# Patient Record
Sex: Male | Born: 1975 | Hispanic: Yes | Marital: Single | Smoking: Former smoker
Health system: Southern US, Community
[De-identification: ages and names within clinical notes are randomized; demographics above are authoritative.]

## PROBLEM LIST (undated history)

## (undated) DIAGNOSIS — S62509A Fracture of unspecified phalanx of unspecified thumb, initial encounter for closed fracture: Secondary | ICD-10-CM

## (undated) DIAGNOSIS — S52502A Unspecified fracture of the lower end of left radius, initial encounter for closed fracture: Secondary | ICD-10-CM

## (undated) HISTORY — PX: NO PAST SURGERIES: SHX2092

---

## 2014-12-18 ENCOUNTER — Emergency Department (HOSPITAL_COMMUNITY)
Admission: EM | Admit: 2014-12-18 | Discharge: 2014-12-18 | Discharge status: Against Medical Advice | Payer: Self-pay | Attending: Emergency Medicine | Admitting: Emergency Medicine

## 2014-12-18 ENCOUNTER — Encounter (HOSPITAL_COMMUNITY): Payer: Self-pay | Admitting: Emergency Medicine

## 2014-12-18 DIAGNOSIS — Z720 Tobacco use: Secondary | ICD-10-CM | POA: Insufficient documentation

## 2014-12-18 DIAGNOSIS — F10129 Alcohol abuse with intoxication, unspecified: Secondary | ICD-10-CM | POA: Insufficient documentation
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
## 2015-05-24 ENCOUNTER — Emergency Department (HOSPITAL_COMMUNITY)
Admission: EM | Admit: 2015-05-24 | Discharge: 2015-05-25 | Disposition: A | Discharge status: Home / Self Care | Payer: Self-pay | Attending: Emergency Medicine | Admitting: Emergency Medicine

## 2015-05-24 ENCOUNTER — Emergency Department (HOSPITAL_COMMUNITY): Payer: Self-pay

## 2015-05-24 ENCOUNTER — Encounter (HOSPITAL_COMMUNITY): Payer: Self-pay | Admitting: Emergency Medicine

## 2015-05-24 DIAGNOSIS — Y9389 Activity, other specified: Secondary | ICD-10-CM | POA: Insufficient documentation

## 2015-05-24 DIAGNOSIS — F10120 Alcohol abuse with intoxication, uncomplicated: Secondary | ICD-10-CM | POA: Insufficient documentation

## 2015-05-24 DIAGNOSIS — S0191XA Laceration without foreign body of unspecified part of head, initial encounter: Secondary | ICD-10-CM

## 2015-05-24 DIAGNOSIS — Z720 Tobacco use: Secondary | ICD-10-CM | POA: Insufficient documentation

## 2015-05-24 DIAGNOSIS — Y9289 Other specified places as the place of occurrence of the external cause: Secondary | ICD-10-CM | POA: Insufficient documentation

## 2015-05-24 DIAGNOSIS — S0101XA Laceration without foreign body of scalp, initial encounter: Secondary | ICD-10-CM | POA: Insufficient documentation

## 2015-05-24 DIAGNOSIS — Z23 Encounter for immunization: Secondary | ICD-10-CM | POA: Insufficient documentation

## 2015-05-24 DIAGNOSIS — Y998 Other external cause status: Secondary | ICD-10-CM | POA: Insufficient documentation

## 2015-05-24 MED ORDER — LIDOCAINE-EPINEPHRINE (PF) 2 %-1:200000 IJ SOLN
20.0000 mL | Freq: Once | INTRAMUSCULAR | Status: DC
  Filled 2015-05-24: qty 20

## 2015-05-24 MED ORDER — TETANUS-DIPHTH-ACELL PERTUSSIS 5-2.5-18.5 LF-MCG/0.5 IM SUSP
0.5000 mL | Freq: Once | INTRAMUSCULAR | Status: AC
  Administered 2015-05-24: 0.5 mL via INTRAMUSCULAR
  Filled 2015-05-24: qty 0.5
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
## 2015-05-25 MED ORDER — ONDANSETRON 8 MG PO TBDP
8.0000 mg | ORAL_TABLET | Freq: Once | ORAL | Status: AC
  Administered 2015-05-25: 8 mg via ORAL
  Filled 2015-05-25: qty 1
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
## 2015-05-25 NOTE — ED Notes (Signed)
Pt was able to ambulate without no assistance to the bathroom, pt has a steady gait

## 2015-05-25 NOTE — ED Provider Notes (Signed)
0200 - Patient ambulatory independently to the restroom without difficulty. Will discharge with return precautions.  Filed Vitals:   05/24/15 2002 05/24/15 2213 05/25/15 0016 05/25/15 0155  BP: 96/63 100/66 100/59 106/77  Pulse: 71 69 66 66  Temp: 97.4 F (36.3 C)   97.5 F (36.4 C)  TempSrc: Oral   Oral  Resp: SpO2: 94% 95% 95% 97%     Antony Madura, PA-C 05/25/15 1610  April Palumbo, MD 05/25/15 [phone]

## 2015-06-07 ENCOUNTER — Encounter (HOSPITAL_COMMUNITY): Payer: Self-pay | Admitting: Emergency Medicine

## 2015-06-07 ENCOUNTER — Emergency Department (HOSPITAL_COMMUNITY)
Admission: EM | Admit: 2015-06-07 | Discharge: 2015-06-07 | Disposition: A | Discharge status: Home / Self Care | Payer: Self-pay | Attending: Emergency Medicine | Admitting: Emergency Medicine

## 2015-06-07 DIAGNOSIS — Z720 Tobacco use: Secondary | ICD-10-CM | POA: Insufficient documentation

## 2015-06-07 DIAGNOSIS — Z4802 Encounter for removal of sutures: Secondary | ICD-10-CM | POA: Insufficient documentation

## 2015-06-07 NOTE — ED Notes (Signed)
Per pt, states suture removal-back of head

## 2015-06-07 NOTE — Discharge Instructions (Signed)
Remoción de la sutura, cuidados posteriores  (Suture Removal, Care After)  Siga estas instrucciones durante las próximas semanas. Estas indicaciones le proporcionan información general acerca de cómo deberá cuidarse después del procedimiento. El médico también podrá darle instrucciones más específicas. El tratamiento se ha planificado de acuerdo a las prácticas médicas actuales, pero a veces se producen problemas. Comuníquese con el médico si tiene algún problema o tiene dudas después del procedimiento.  QUÉ ESPERAR DESPUÉS DEL PROCEDIMIENTO  Después de que le retiren los puntos (suturas), es normal experimentar lo siguiente:  · Molestias e hinchazón en la zona de la herida.  · Leve enrojecimiento en la zona de la herida.  INSTRUCCIONES PARA EL CUIDADO EN EL HOGAR   · Si tiene bandas adhesivas en la piel sobre la zona de la herida, no las retire. Se caerán solas en unos pocos días. Si las bandas adhesivas siguen en su lugar después de 14 días, entonces puede retirarlas.  · Cambie los apósitos (vendajes), al menos, una vez al día o según las instrucciones del médico. Si el vendaje se adhiere, remójelo con agua jabonosa tibia.  · Solo aplique un ungüento o crema según las indicaciones del médico. Si usa crema o ungüento, lave la zona con agua y jabón dos veces por día para quitarlo todo. Enjuague el jabón y seque suavemente la zona con una toalla limpia.  · Mantenga la herida limpia y seca. Si el vendaje se moja, se ensucia o tiene mal olor, cámbielo tan pronto como pueda.  · Continúe protegiendo la herida de lesiones.  · Use protector solar cuando salga al sol. Las cicatrices nuevas se broncean fácilmente.  SOLICITE ATENCIÓN MÉDICA SI:  · Aumenta el enrojecimiento, la hinchazón o el dolor en la zona afectada.  · Observa que sale pus de la herida.  · Tiene fiebre.  · Advierte un olor fétido que proviene de la herida o del vendaje.  · La herida se abre (los bordes se separan).     Esta información no tiene como fin  reemplazar el consejo del médico. Asegúrese de hacerle al médico cualquier pregunta que tenga.     Document Released: 05/16/2005 Document Revised: 05/27/2013  Elsevier Interactive Patient Education ©2016 Elsevier Inc.
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
## 2016-01-25 ENCOUNTER — Encounter (HOSPITAL_COMMUNITY): Payer: Self-pay | Admitting: Emergency Medicine

## 2016-01-25 ENCOUNTER — Emergency Department (HOSPITAL_COMMUNITY)
Admission: EM | Admit: 2016-01-25 | Discharge: 2016-01-25 | Disposition: A | Discharge status: Home / Self Care | Payer: Self-pay | Attending: Emergency Medicine | Admitting: Emergency Medicine

## 2016-01-25 DIAGNOSIS — F17200 Nicotine dependence, unspecified, uncomplicated: Secondary | ICD-10-CM | POA: Insufficient documentation

## 2016-01-25 DIAGNOSIS — E876 Hypokalemia: Secondary | ICD-10-CM | POA: Insufficient documentation

## 2016-01-25 DIAGNOSIS — F10920 Alcohol use, unspecified with intoxication, uncomplicated: Secondary | ICD-10-CM

## 2016-01-25 DIAGNOSIS — F10129 Alcohol abuse with intoxication, unspecified: Secondary | ICD-10-CM | POA: Insufficient documentation

## 2016-01-25 DIAGNOSIS — Y908 Blood alcohol level of 240 mg/100 ml or more: Secondary | ICD-10-CM | POA: Insufficient documentation

## 2016-01-25 LAB — COMPREHENSIVE METABOLIC PANEL
ALBUMIN: 3.8 g/dL (ref 3.5–5.0)
ALT: 19 U/L (ref 17–63)
AST: 24 U/L (ref 15–41)
Alkaline Phosphatase: 48 U/L (ref 38–126)
Anion gap: 10 (ref 5–15)
BILIRUBIN TOTAL: 0.2 mg/dL — AB (ref 0.3–1.2)
CHLORIDE: 109 mmol/L (ref 101–111)
CO2: 22 mmol/L (ref 22–32)
CREATININE: 0.45 mg/dL — AB (ref 0.61–1.24)
Calcium: 9 mg/dL (ref 8.9–10.3)
Glucose, Bld: 104 mg/dL — ABNORMAL HIGH (ref 65–99)
Potassium: 3.2 mmol/L — ABNORMAL LOW (ref 3.5–5.1)
Sodium: 141 mmol/L (ref 135–145)
TOTAL PROTEIN: 6.7 g/dL (ref 6.5–8.1)

## 2016-01-25 LAB — ETHANOL: ALCOHOL ETHYL (B): 310 mg/dL — AB (ref <5)

## 2016-01-25 LAB — CBC WITH DIFFERENTIAL/PLATELET
BASOS ABS: 0 10*3/uL (ref 0.0–0.1)
BASOS PCT: 1 %
EOS PCT: 1 %
Eosinophils Absolute: 0 10*3/uL (ref 0.0–0.7)
HCT: 43.3 % (ref 39.0–52.0)
Hemoglobin: 14.7 g/dL (ref 13.0–17.0)
Lymphocytes Relative: 30 %
Lymphs Abs: 1.3 10*3/uL (ref 0.7–4.0)
MCH: 30.8 pg (ref 26.0–34.0)
MCHC: 33.9 g/dL (ref 30.0–36.0)
MCV: 90.8 fL (ref 78.0–100.0)
MONO ABS: 0.3 10*3/uL (ref 0.1–1.0)
Monocytes Relative: 6 %
Neutro Abs: 2.8 10*3/uL (ref 1.7–7.7)
Neutrophils Relative %: 62 %
PLATELETS: 303 10*3/uL (ref 150–400)
RBC: 4.77 MIL/uL (ref 4.22–5.81)
RDW: 12.7 % (ref 11.5–15.5)
WBC: 4.4 10*3/uL (ref 4.0–10.5)

## 2016-01-25 LAB — SALICYLATE LEVEL

## 2016-01-25 LAB — ACETAMINOPHEN LEVEL: Acetaminophen (Tylenol), Serum: <10 ug/mL — ABNORMAL LOW (ref 10–30)

## 2016-01-25 MED ORDER — SODIUM CHLORIDE 0.9 % IV BOLUS (SEPSIS)
1000.0000 mL | Freq: Once | INTRAVENOUS | Status: AC
Start: 2016-01-25 — End: 2016-01-25
  Administered 2016-01-25: 1000 mL via INTRAVENOUS

## 2016-01-25 MED ORDER — POTASSIUM CHLORIDE CRYS ER 20 MEQ PO TBCR
40.0000 meq | EXTENDED_RELEASE_TABLET | Freq: Once | ORAL | Status: AC
  Administered 2016-01-25: 40 meq via ORAL
  Filled 2016-01-25: qty 2

## 2016-01-25 NOTE — ED Provider Notes (Signed)
  Physical Exam  BP 95/64 mmHg  Pulse 69  Temp(Src) 98.1 F (36.7 C) (Oral)  Resp 15  SpO2 98%  Physical Exam  ED Course  Procedures  MDM 3:24 PM- Sign out from Harley-DavidsonLisa sanders, PA-C ETOH 310 Fluids. Potassium when Awake Ambulate when awake DC when clinically sober 3:45 PM- Pt able to ambulate without difficulty. Able to answer questions appropriately. At time of discharge, Patient is in no acute distress. Vital Signs are stable. Patient is able to ambulate. Patient able to tolerate PO.         Audry Piliyler Beatriz Settles, PA-C 01/25/16 1545  Mancel BaleElliott Wentz, MD 01/26/16 [phone]

## 2016-01-25 NOTE — ED Notes (Signed)
Pt urinated on entire bedding and floor surrounding bed. Unable to obtain sample from this. Pt disoriented at this time.

## 2016-01-25 NOTE — ED Notes (Signed)
CRITICAL VALUE ALERT  Critical value received:  ETOH 310  Date of notification:  01/25/2016  Time of notification: 12:34  Critical value read back: YES  Nurse who received alert:  Fredirick MaudlinHolley Areej Tayler RN   PA Sharilyn SitesLisa Sanders notified.
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
## 2016-01-25 NOTE — Discharge Instructions (Signed)
Intoxicación alcohólica  (Alcohol Intoxication)  La intoxicación alcohólica se produce cuando la cantidad de alcohol que se ha consumido daña la capacidad de funcionamiento mental y físico. El alcohol deteriora directamente la actividad química normal del cerebro. Beber grandes cantidades de alcohol puede conducir a cambios en el funcionamiento mental y en el comportamiento, y puede causar muchos efectos físicos que pueden ser perjudiciales.   La intoxicación alcohólica puede variar en gravedad desde leve hasta muy grave. Hay varios factores que pueden afectar el nivel de intoxicación que se produce, como la edad de la persona, el sexo, el peso, la frecuencia de consumo de alcohol, y la presencia de otras enfermedades médicas (como diabetes, convulsiones o enfermedades del corazón). Los niveles peligrosos de intoxicación por alcohol pueden ocurrir cuando las personas beben grandes cantidades de alcohol en un corto periodo de tiempo (emborracharse). El alcohol también puede ser especialmente peligroso cuando se combina con ciertos medicamentos recetados o drogas "recreativas".  SIGNOS Y SÍNTOMAS  Algunos de los signos y síntomas comunes de intoxicación leve por alcohol incluyen:  · Pérdida de la coordinación.  · Cambios en el estado de ánimo y la conducta.  · Incapacidad para razonar.  · Hablar arrastrando las palabras.  A medida que la intoxicación por alcohol avanza a niveles más graves, van a aparecer otros signos y síntomas. Estos pueden ser:  · Vómitos.  · Confusión y alteración de la memoria.  · Disminución de la frecuencia respiratoria.  · Convulsiones.  · Pérdida de la conciencia.  DIAGNÓSTICO   El médico le hará una historia clínica y un examen físico. Se le preguntará acerca de la cantidad y el tipo de alcohol que ha consumido. Se le realizarán análisis de sangre para medir la concentración de alcohol en sangre. En muchos lugares, el nivel de alcohol en la sangre debe ser inferior a 80 mg / dL (0,08%) para  poder conducir legalmente. Sin embargo, hay muchos efectos peligrosos del alcohol que pueden ocurrir con niveles mucho más bajos.   TRATAMIENTO   Las personas con intoxicación por alcohol a menudo no requieren tratamiento. La mayor parte de los efectos de la intoxicación por alcohol son temporales, y desaparecen a medida que el alcohol abandona el cuerpo de forma natural. El profesional controlará su estado hasta que esté lo suficientemente estable como para volver a casa. A veces se administran líquidos por vía intravenosa para ayudar a evitar la deshidratación.   INSTRUCCIONES PARA EL CUIDADO EN EL HOGAR  · No conduzca vehículos después de beber alcohol.  · Manténgase hidratado. Beba gran cantidad de líquido para mantener la orina de tono claro o color amarillo pálido. Evite la cafeína.    · Tome sólo medicamentos de venta libre o recetados, según las indicaciones del médico.    SOLICITE ATENCIÓN MÉDICA SI:   · Tiene vómitos persistentes.    · No mejora luego de algunos días.  · Se intoxica con alcohol con frecuencia. El médico podrá ayudarlo a decidir si debe consultar a un terapeuta especializado en el abuso de sustancias.  SOLICITE ATENCIÓN MÉDICA DE INMEDIATO SI:   · Se siente vacilante o tembloroso cuando trata de abandonar el hábito.    · Comienza a temblar de manera incontrolable (convulsiones).    · Vomita sangre. Puede ser sangre de color rojo brillante o similar al sedimento del café negro.    · Observa sangre en la materia fecal. Puede ser de color rojo brillante o de aspecto alquitranado, con olor fétido.    · Se siente mareado o se desmaya.      ASEGÚRESE DE QUE:   · Comprende estas instrucciones.  · Controlará su afección.  · Recibirá ayuda de inmediato si no mejora o si empeora.     Esta información no tiene como fin reemplazar el consejo del médico. Asegúrese de hacerle al médico cualquier pregunta que tenga.     Document Released: 08/06/2005 Document Revised: 04/08/2013  Elsevier Interactive Patient  Education ©2016 [employer]

## 2016-01-25 NOTE — ED Notes (Signed)
Pt ambulatory down hallway with steady gait. NAD. Alert and oriented at this time.

## 2016-08-12 ENCOUNTER — Emergency Department (HOSPITAL_COMMUNITY)
Admission: EM | Admit: 2016-08-12 | Discharge: 2016-08-12 | Disposition: A | Discharge status: Home / Self Care | Payer: Self-pay | Attending: Emergency Medicine | Admitting: Emergency Medicine

## 2016-08-12 ENCOUNTER — Encounter (HOSPITAL_COMMUNITY): Payer: Self-pay | Admitting: Emergency Medicine

## 2016-08-12 DIAGNOSIS — F10229 Alcohol dependence with intoxication, unspecified: Secondary | ICD-10-CM | POA: Insufficient documentation

## 2016-08-12 DIAGNOSIS — F10920 Alcohol use, unspecified with intoxication, uncomplicated: Secondary | ICD-10-CM

## 2016-08-12 DIAGNOSIS — F17200 Nicotine dependence, unspecified, uncomplicated: Secondary | ICD-10-CM | POA: Insufficient documentation

## 2016-08-12 DIAGNOSIS — Z5181 Encounter for therapeutic drug level monitoring: Secondary | ICD-10-CM | POA: Insufficient documentation

## 2016-08-12 LAB — CBC WITH DIFFERENTIAL/PLATELET
Basophils Absolute: 0.1 10*3/uL (ref 0.0–0.1)
Basophils Relative: 1 %
EOS ABS: 0 10*3/uL (ref 0.0–0.7)
EOS PCT: 1 %
HCT: 44.4 % (ref 39.0–52.0)
HEMOGLOBIN: 15.5 g/dL (ref 13.0–17.0)
LYMPHS ABS: 2.5 10*3/uL (ref 0.7–4.0)
LYMPHS PCT: 45 %
MCH: 31.9 pg (ref 26.0–34.0)
MCHC: 34.9 g/dL (ref 30.0–36.0)
MCV: 91.4 fL (ref 78.0–100.0)
MONOS PCT: 5 %
Monocytes Absolute: 0.3 10*3/uL (ref 0.1–1.0)
NEUTROS PCT: 48 %
Neutro Abs: 2.6 10*3/uL (ref 1.7–7.7)
Platelets: 386 10*3/uL (ref 150–400)
RBC: 4.86 MIL/uL (ref 4.22–5.81)
RDW: 12.8 % (ref 11.5–15.5)
WBC: 5.4 10*3/uL (ref 4.0–10.5)

## 2016-08-12 LAB — COMPREHENSIVE METABOLIC PANEL
ALK PHOS: 64 U/L (ref 38–126)
ALT: 75 U/L — AB (ref 17–63)
ANION GAP: 13 (ref 5–15)
AST: 91 U/L — ABNORMAL HIGH (ref 15–41)
Albumin: 4.5 g/dL (ref 3.5–5.0)
BUN: 6 mg/dL (ref 6–20)
CALCIUM: 9.4 mg/dL (ref 8.9–10.3)
CO2: 21 mmol/L — AB (ref 22–32)
CREATININE: 0.6 mg/dL — AB (ref 0.61–1.24)
Chloride: 107 mmol/L (ref 101–111)
Glucose, Bld: 109 mg/dL — ABNORMAL HIGH (ref 65–99)
Potassium: 4.5 mmol/L (ref 3.5–5.1)
SODIUM: 141 mmol/L (ref 135–145)
TOTAL PROTEIN: 7.8 g/dL (ref 6.5–8.1)
Total Bilirubin: 0.8 mg/dL (ref 0.3–1.2)

## 2016-08-12 LAB — URINALYSIS, ROUTINE W REFLEX MICROSCOPIC
Bilirubin Urine: NEGATIVE
GLUCOSE, UA: NEGATIVE mg/dL
Hgb urine dipstick: NEGATIVE
KETONES UR: NEGATIVE mg/dL
LEUKOCYTES UA: NEGATIVE
NITRITE: NEGATIVE
PH: 6 (ref 5.0–8.0)
Protein, ur: NEGATIVE mg/dL
SPECIFIC GRAVITY, URINE: 1.005 (ref 1.005–1.030)

## 2016-08-12 LAB — RAPID URINE DRUG SCREEN, HOSP PERFORMED
Amphetamines: NOT DETECTED
Barbiturates: NOT DETECTED
Benzodiazepines: NOT DETECTED
COCAINE: NOT DETECTED
OPIATES: NOT DETECTED
Tetrahydrocannabinol: POSITIVE — AB

## 2016-08-12 LAB — ETHANOL: ALCOHOL ETHYL (B): 330 mg/dL — AB (ref <5)

## 2016-08-12 LAB — TROPONIN I: Troponin I: <0.03 ng/mL (ref <0.03)

## 2016-08-12 MED ORDER — SODIUM CHLORIDE 0.9 % IV BOLUS (SEPSIS)
1000.0000 mL | Freq: Once | INTRAVENOUS | Status: AC
  Administered 2016-08-12: 1000 mL via INTRAVENOUS

## 2016-08-12 NOTE — ED Notes (Signed)
Pt able to ambulate without problem.
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
## 2016-08-12 NOTE — Discharge Instructions (Signed)
Follow-up with your doctor.  Return to the ED if you develop new or worsening symptoms. °
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
## 2016-08-18 ENCOUNTER — Emergency Department (HOSPITAL_COMMUNITY): Payer: Self-pay

## 2016-08-18 ENCOUNTER — Emergency Department (HOSPITAL_COMMUNITY)
Admission: EM | Admit: 2016-08-18 | Discharge: 2016-08-19 | Disposition: A | Discharge status: Home / Self Care | Payer: Self-pay | Attending: Emergency Medicine | Admitting: Emergency Medicine

## 2016-08-18 ENCOUNTER — Encounter (HOSPITAL_COMMUNITY): Payer: Self-pay | Admitting: Oncology

## 2016-08-18 DIAGNOSIS — S62509A Fracture of unspecified phalanx of unspecified thumb, initial encounter for closed fracture: Secondary | ICD-10-CM

## 2016-08-18 DIAGNOSIS — Y929 Unspecified place or not applicable: Secondary | ICD-10-CM | POA: Insufficient documentation

## 2016-08-18 DIAGNOSIS — S52572A Other intraarticular fracture of lower end of left radius, initial encounter for closed fracture: Secondary | ICD-10-CM | POA: Insufficient documentation

## 2016-08-18 DIAGNOSIS — Y939 Activity, unspecified: Secondary | ICD-10-CM | POA: Insufficient documentation

## 2016-08-18 DIAGNOSIS — S52502A Unspecified fracture of the lower end of left radius, initial encounter for closed fracture: Secondary | ICD-10-CM

## 2016-08-18 DIAGNOSIS — F10920 Alcohol use, unspecified with intoxication, uncomplicated: Secondary | ICD-10-CM

## 2016-08-18 DIAGNOSIS — W1839XA Other fall on same level, initial encounter: Secondary | ICD-10-CM | POA: Insufficient documentation

## 2016-08-18 DIAGNOSIS — Z79899 Other long term (current) drug therapy: Secondary | ICD-10-CM | POA: Insufficient documentation

## 2016-08-18 DIAGNOSIS — F10120 Alcohol abuse with intoxication, uncomplicated: Secondary | ICD-10-CM | POA: Insufficient documentation

## 2016-08-18 DIAGNOSIS — F17200 Nicotine dependence, unspecified, uncomplicated: Secondary | ICD-10-CM | POA: Insufficient documentation

## 2016-08-18 DIAGNOSIS — Y999 Unspecified external cause status: Secondary | ICD-10-CM | POA: Insufficient documentation

## 2016-08-18 HISTORY — DX: Unspecified fracture of the lower end of left radius, initial encounter for closed fracture: S52.502A

## 2016-08-18 HISTORY — DX: Fracture of unspecified phalanx of unspecified thumb, initial encounter for closed fracture: S62.509A

## 2016-08-18 MED ORDER — KETOROLAC TROMETHAMINE 60 MG/2ML IM SOLN
60.0000 mg | Freq: Once | INTRAMUSCULAR | Status: DC
  Filled 2016-08-18: qty 2

## 2016-08-18 NOTE — ED Notes (Signed)
Bed: [id number] Expected date:  Expected time:  Means of arrival:  Comments: EMS 40 yo male pain left wrist and arm-deformity

## 2016-08-18 NOTE — ED Triage Notes (Signed)
Pt bib EMS d/t left arm pain.  Denies injury.  Per EMS pt has deformity noted to left wrist/forearm.  Splint applied in the field.  ETOH on board.  A&O x 4.  Per EMS pt c/o pain is worse in left wrist.
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
## 2016-08-19 MED ORDER — IBUPROFEN 600 MG PO TABS: 600.0000 mg | ORAL_TABLET | Freq: Four times a day (QID) | ORAL | 0 refills | Status: DC | PRN

## 2016-08-19 MED ORDER — NAPROXEN 500 MG PO TABS
500.0000 mg | ORAL_TABLET | Freq: Once | ORAL | Status: AC
  Administered 2016-08-19: 500 mg via ORAL
  Filled 2016-08-19: qty 1

## 2016-08-19 NOTE — Discharge Instructions (Signed)
Llame a la oficina del Dr. Merlyn LotKuzma para programar una cita de seguimiento en la oficina para asegurar la cicatrizacin adecuada de su fractura / fractura de hueso. Mantenga la frula de su mueca en todo momento. Mantenga su brazo izquierdo elevado con un cabestrillo. Tome ibuprofeno para [employer] Technology Officerel dolor. Use una compresa de hielo Intelsobre la parte superior de la frula para limitar la hinchazn y ayudar a Curatormejorar el dolor. Deje de beber alcohol en exceso ya que esto puede provocar ms cadas y empeoramiento de su lesin. Puede regresar por sntomas nuevos o preocupantes.  Call the office of Dr. Merlyn LotKuzma to schedule an appointment for follow up in the office to ensure proper healing of your fracture/broken bone. Keep your wrist splint on at all times. Keep your left arm elevated with a sling. Take ibuprofen for pain. Use an ice pack over the top of your splint to limit swelling and help improve pain. Stop drinking alcohol in excess as this may lead to further falls and worsening of your injury. You may return for new or concerning symptoms.

## 2016-08-23 ENCOUNTER — Other Ambulatory Visit: Payer: Self-pay | Admitting: Orthopedic Surgery

## 2016-08-24 ENCOUNTER — Other Ambulatory Visit: Payer: Self-pay | Admitting: Orthopedic Surgery

## 2016-08-27 NOTE — Pre-Procedure Instructions (Signed)
Cesar will be interpreter for pt., per Judy at [facility]; please call [phone] if surgery time changes. 

## 2016-08-28 ENCOUNTER — Ambulatory Visit (HOSPITAL_BASED_OUTPATIENT_CLINIC_OR_DEPARTMENT_OTHER): Admission: RE | Admit: 2016-08-28 | Payer: Self-pay | Source: Ambulatory Visit | Admitting: Orthopedic Surgery

## 2016-08-28 ENCOUNTER — Encounter (HOSPITAL_BASED_OUTPATIENT_CLINIC_OR_DEPARTMENT_OTHER): Admission: RE | Payer: Self-pay | Source: Ambulatory Visit

## 2016-08-28 SURGERY — OPEN REDUCTION INTERNAL FIXATION (ORIF) DISTAL RADIUS FRACTURE
Anesthesia: Choice | Laterality: Right

## 2016-09-04 ENCOUNTER — Other Ambulatory Visit: Payer: Self-pay | Admitting: Orthopedic Surgery

## 2016-09-10 ENCOUNTER — Encounter (HOSPITAL_BASED_OUTPATIENT_CLINIC_OR_DEPARTMENT_OTHER): Payer: Self-pay | Admitting: *Deleted

## 2016-09-11 ENCOUNTER — Other Ambulatory Visit: Payer: Self-pay | Admitting: Orthopedic Surgery

## 2016-09-13 ENCOUNTER — Encounter (HOSPITAL_BASED_OUTPATIENT_CLINIC_OR_DEPARTMENT_OTHER)
Admission: RE | Disposition: A | Discharge status: Home / Self Care | Payer: Self-pay | Source: Ambulatory Visit | Attending: Orthopedic Surgery

## 2016-09-13 ENCOUNTER — Ambulatory Visit (HOSPITAL_BASED_OUTPATIENT_CLINIC_OR_DEPARTMENT_OTHER): Payer: Self-pay | Admitting: Certified Registered"

## 2016-09-13 ENCOUNTER — Encounter (HOSPITAL_BASED_OUTPATIENT_CLINIC_OR_DEPARTMENT_OTHER): Payer: Self-pay | Admitting: Certified Registered"

## 2016-09-13 ENCOUNTER — Ambulatory Visit (HOSPITAL_BASED_OUTPATIENT_CLINIC_OR_DEPARTMENT_OTHER)
Admission: RE | Admit: 2016-09-13 | Discharge: 2016-09-13 | Disposition: A | Discharge status: Home / Self Care | Payer: Self-pay | Source: Ambulatory Visit | Attending: Orthopedic Surgery | Admitting: Orthopedic Surgery

## 2016-09-13 DIAGNOSIS — S62521A Displaced fracture of distal phalanx of right thumb, initial encounter for closed fracture: Secondary | ICD-10-CM | POA: Insufficient documentation

## 2016-09-13 DIAGNOSIS — F17210 Nicotine dependence, cigarettes, uncomplicated: Secondary | ICD-10-CM | POA: Insufficient documentation

## 2016-09-13 DIAGNOSIS — S52572A Other intraarticular fracture of lower end of left radius, initial encounter for closed fracture: Secondary | ICD-10-CM | POA: Insufficient documentation

## 2016-09-13 HISTORY — PX: OPEN REDUCTION INTERNAL FIXATION (ORIF) DISTAL PHALANX: SHX6236

## 2016-09-13 HISTORY — DX: Unspecified fracture of the lower end of left radius, initial encounter for closed fracture: S52.502A

## 2016-09-13 HISTORY — DX: Fracture of unspecified phalanx of unspecified thumb, initial encounter for closed fracture: S62.509A

## 2016-09-13 HISTORY — PX: OPEN REDUCTION INTERNAL FIXATION (ORIF) DISTAL RADIAL FRACTURE: SHX5989

## 2016-09-13 SURGERY — OPEN REDUCTION INTERNAL FIXATION (ORIF) DISTAL RADIUS FRACTURE
Anesthesia: General | Site: Wrist | Laterality: Right

## 2016-09-13 MED ORDER — KETOROLAC TROMETHAMINE 30 MG/ML IJ SOLN
INTRAMUSCULAR | Status: DC | PRN
  Administered 2016-09-13: 30 mg via INTRAVENOUS

## 2016-09-13 MED ORDER — ONDANSETRON HCL 4 MG/2ML IJ SOLN
INTRAMUSCULAR | Status: DC | PRN
  Administered 2016-09-13: 4 mg via INTRAVENOUS

## 2016-09-13 MED ORDER — MEPERIDINE HCL 25 MG/ML IJ SOLN: 6.2500 mg | INTRAMUSCULAR | Status: DC | PRN

## 2016-09-13 MED ORDER — BUPIVACAINE-EPINEPHRINE (PF) 0.5% -1:200000 IJ SOLN
INTRAMUSCULAR | Status: DC | PRN
  Administered 2016-09-13: 30 mL via PERINEURAL

## 2016-09-13 MED ORDER — OXYCODONE-ACETAMINOPHEN 5-325 MG PO TABS: ORAL_TABLET | ORAL | 0 refills | Status: DC

## 2016-09-13 MED ORDER — DEXAMETHASONE SODIUM PHOSPHATE 4 MG/ML IJ SOLN
INTRAMUSCULAR | Status: DC | PRN
  Administered 2016-09-13: 10 mg via INTRAVENOUS

## 2016-09-13 MED ORDER — KETOROLAC TROMETHAMINE 30 MG/ML IJ SOLN
INTRAMUSCULAR | Status: AC
  Filled 2016-09-13: qty 1

## 2016-09-13 MED ORDER — HYDROMORPHONE HCL 1 MG/ML IJ SOLN
INTRAMUSCULAR | Status: AC
  Filled 2016-09-13: qty 1

## 2016-09-13 MED ORDER — FENTANYL CITRATE (PF) 100 MCG/2ML IJ SOLN
INTRAMUSCULAR | Status: AC
  Filled 2016-09-13: qty 2

## 2016-09-13 MED ORDER — MIDAZOLAM HCL 2 MG/2ML IJ SOLN
INTRAMUSCULAR | Status: AC
  Filled 2016-09-13: qty 2

## 2016-09-13 MED ORDER — MIDAZOLAM HCL 2 MG/2ML IJ SOLN
1.0000 mg | INTRAMUSCULAR | Status: DC | PRN
  Administered 2016-09-13: 2 mg via INTRAVENOUS
  Administered 2016-09-13: 1 mg via INTRAVENOUS

## 2016-09-13 MED ORDER — LACTATED RINGERS IV SOLN
INTRAVENOUS | Status: DC
  Administered 2016-09-13: 13:00:00 via INTRAVENOUS

## 2016-09-13 MED ORDER — PROPOFOL 10 MG/ML IV BOLUS
INTRAVENOUS | Status: DC | PRN
  Administered 2016-09-13: 200 mg via INTRAVENOUS

## 2016-09-13 MED ORDER — CEFAZOLIN SODIUM-DEXTROSE 2-4 GM/100ML-% IV SOLN
INTRAVENOUS | Status: AC
  Filled 2016-09-13: qty 100

## 2016-09-13 MED ORDER — 0.9 % SODIUM CHLORIDE (POUR BTL) OPTIME
TOPICAL | Status: DC | PRN
  Administered 2016-09-13: 200 mL

## 2016-09-13 MED ORDER — CHLORHEXIDINE GLUCONATE 4 % EX LIQD: 60.0000 mL | Freq: Once | CUTANEOUS | Status: DC

## 2016-09-13 MED ORDER — BUPIVACAINE HCL (PF) 0.25 % IJ SOLN
INTRAMUSCULAR | Status: DC | PRN
  Administered 2016-09-13: 5 mL

## 2016-09-13 MED ORDER — CEFAZOLIN SODIUM-DEXTROSE 2-4 GM/100ML-% IV SOLN
2.0000 g | INTRAVENOUS | Status: AC
  Administered 2016-09-13: 2 g via INTRAVENOUS

## 2016-09-13 MED ORDER — FENTANYL CITRATE (PF) 100 MCG/2ML IJ SOLN
50.0000 ug | INTRAMUSCULAR | Status: AC | PRN
  Administered 2016-09-13 (×5): 50 ug via INTRAVENOUS

## 2016-09-13 MED ORDER — LIDOCAINE 2% (20 MG/ML) 5 ML SYRINGE
INTRAMUSCULAR | Status: DC | PRN
  Administered 2016-09-13: 30 mg via INTRAVENOUS

## 2016-09-13 MED ORDER — SCOPOLAMINE 1 MG/3DAYS TD PT72: 1.0000 | MEDICATED_PATCH | Freq: Once | TRANSDERMAL | Status: DC | PRN

## 2016-09-13 MED ORDER — HYDROMORPHONE HCL 1 MG/ML IJ SOLN
0.2500 mg | INTRAMUSCULAR | Status: DC | PRN
  Administered 2016-09-13: 0.5 mg via INTRAVENOUS

## 2016-09-13 MED ORDER — MIDAZOLAM HCL 2 MG/2ML IJ SOLN: 0.5000 mg | Freq: Once | INTRAMUSCULAR | Status: DC | PRN

## 2016-09-13 MED ORDER — PROMETHAZINE HCL 25 MG/ML IJ SOLN: 6.2500 mg | INTRAMUSCULAR | Status: DC | PRN

## 2016-09-13 SURGICAL SUPPLY — 74 items
BANDAGE ACE 3X5.8 VEL STRL LF (GAUZE/BANDAGES/DRESSINGS) ×4 IMPLANT
BIT DRILL 2.0 LNG QUCK RELEASE (BIT) ×2 IMPLANT
BIT DRILL 2.8 QUICK RELEASE (BIT) ×2 IMPLANT
BLADE SURG 15 STRL LF DISP TIS (BLADE) ×6 IMPLANT
BLADE SURG 15 STRL SS (BLADE) ×6
BNDG COHESIVE 1X5 TAN STRL LF (GAUZE/BANDAGES/DRESSINGS) ×4 IMPLANT
BNDG ESMARK 4X9 LF (GAUZE/BANDAGES/DRESSINGS) ×4 IMPLANT
BNDG GAUZE ELAST 4 BULKY (GAUZE/BANDAGES/DRESSINGS) ×8 IMPLANT
BNDG PLASTER X FAST 3X3 WHT LF (CAST SUPPLIES) ×20 IMPLANT
CHLORAPREP W/TINT 26ML (MISCELLANEOUS) ×8 IMPLANT
CORDS BIPOLAR (ELECTRODE) ×4 IMPLANT
COVER BACK TABLE 60X90IN (DRAPES) ×4 IMPLANT
COVER MAYO STAND STRL (DRAPES) ×8 IMPLANT
CUFF TOURNIQUET SINGLE 18IN (TOURNIQUET CUFF) ×8 IMPLANT
CUFF TOURNIQUET SINGLE 24IN (TOURNIQUET CUFF) IMPLANT
DRAPE EXTREMITY T 121X128X90 (DRAPE) ×8 IMPLANT
DRAPE OEC MINIVIEW 54X84 (DRAPES) ×4 IMPLANT
DRAPE SURG 17X23 STRL (DRAPES) ×8 IMPLANT
DRILL 2.0 LNG QUICK RELEASE (BIT) ×4
DRILL 2.8 QUICK RELEASE (BIT) ×4
GAUZE SPONGE 4X4 12PLY STRL (GAUZE/BANDAGES/DRESSINGS) ×4 IMPLANT
GAUZE XEROFORM 1X8 LF (GAUZE/BANDAGES/DRESSINGS) ×8 IMPLANT
GLOVE BIO SURGEON STRL SZ 6.5 (GLOVE) ×9 IMPLANT
GLOVE BIO SURGEON STRL SZ7.5 (GLOVE) ×12 IMPLANT
GLOVE BIO SURGEONS STRL SZ 6.5 (GLOVE) ×3
GLOVE BIOGEL PI IND STRL 7.0 (GLOVE) ×6 IMPLANT
GLOVE BIOGEL PI IND STRL 8 (GLOVE) ×4 IMPLANT
GLOVE BIOGEL PI IND STRL 8.5 (GLOVE) ×2 IMPLANT
GLOVE BIOGEL PI INDICATOR 7.0 (GLOVE) ×6
GLOVE BIOGEL PI INDICATOR 8 (GLOVE) ×4
GLOVE BIOGEL PI INDICATOR 8.5 (GLOVE) ×2
GLOVE SURG ORTHO 8.0 STRL STRW (GLOVE) ×4 IMPLANT
GOWN STRL REUS W/ TWL LRG LVL3 (GOWN DISPOSABLE) ×4 IMPLANT
GOWN STRL REUS W/TWL LRG LVL3 (GOWN DISPOSABLE) ×4
GOWN STRL REUS W/TWL XL LVL3 (GOWN DISPOSABLE) ×8 IMPLANT
GUIDEWIRE ORTHO 0.054X6 (WIRE) ×12 IMPLANT
K-WIRE .062X4 (WIRE) ×4 IMPLANT
NEEDLE HYPO 25X1 1.5 SAFETY (NEEDLE) ×4 IMPLANT
NS IRRIG 1000ML POUR BTL (IV SOLUTION) ×4 IMPLANT
PACK BASIN DAY SURGERY FS (CUSTOM PROCEDURE TRAY) ×4 IMPLANT
PAD CAST 3X4 CTTN HI CHSV (CAST SUPPLIES) ×2 IMPLANT
PADDING CAST ABS 4INX4YD NS (CAST SUPPLIES) ×2
PADDING CAST ABS COTTON 4X4 ST (CAST SUPPLIES) ×2 IMPLANT
PADDING CAST COTTON 3X4 STRL (CAST SUPPLIES) ×2
PLATE ACU LOC PROX STD LEFT (Plate) ×4 IMPLANT
SCREW CORT FT 20X2.3XLCK HEX (Screw) ×4 IMPLANT
SCREW CORTICAL LOCKING 2.3X18M (Screw) ×4 IMPLANT
SCREW CORTICAL LOCKING 2.3X20M (Screw) ×8 IMPLANT
SCREW FX18X2.3XSMTH LCK NS CRT (Screw) ×4 IMPLANT
SCREW FX20X2.3XSMTH LCK NS CRT (Screw) ×4 IMPLANT
SCREW HEXALOBE NON-LOCK 3.5X16 (Screw) ×4 IMPLANT
SCREW NLCKG 13 3.5X13 HEXA (Screw) ×2 IMPLANT
SCREW NON-LOCK 3.5X13 (Screw) ×4 IMPLANT
SCREW NONLOCK HEX 3.5X12 (Screw) ×4 IMPLANT
SLEEVE SCD COMPRESS KNEE MED (MISCELLANEOUS) ×4 IMPLANT
SLING ARM FOAM STRAP MED (SOFTGOODS) ×4 IMPLANT
SPLINT FINGER 3.25 BULB 911905 (SOFTGOODS) ×4 IMPLANT
STOCKINETTE 4X48 STRL (DRAPES) ×8 IMPLANT
SUCTION FRAZIER HANDLE 10FR (MISCELLANEOUS)
SUCTION TUBE FRAZIER 10FR DISP (MISCELLANEOUS) IMPLANT
SUT ETHILON 3 0 PS 1 (SUTURE) IMPLANT
SUT ETHILON 4 0 PS 2 18 (SUTURE) ×12 IMPLANT
SUT VIC AB 2-0 SH 27 (SUTURE)
SUT VIC AB 2-0 SH 27XBRD (SUTURE) IMPLANT
SUT VIC AB 3-0 PS1 18 (SUTURE)
SUT VIC AB 3-0 PS1 18XBRD (SUTURE) IMPLANT
SUT VICRYL 4-0 PS2 18IN ABS (SUTURE) ×8 IMPLANT
SYR BULB 3OZ (MISCELLANEOUS) ×4 IMPLANT
SYR CONTROL 10ML LL (SYRINGE) ×4 IMPLANT
TOWEL OR 17X24 6PK STRL BLUE (TOWEL DISPOSABLE) ×8 IMPLANT
TOWEL OR NON WOVEN STRL DISP B (DISPOSABLE) ×4 IMPLANT
TUBE CONNECTING 20'X1/4 (TUBING)
TUBE CONNECTING 20X1/4 (TUBING) IMPLANT
UNDERPAD 30X30 (UNDERPADS AND DIAPERS) ×4 IMPLANT

## 2016-09-13 NOTE — Brief Op Note (Signed)
09/13/2016  6:58 PM  PATIENT:  Logan Lee  41 y.o. male  PRE-OPERATIVE DIAGNOSIS:  Left Distal Radius Fracture  S52.572A  Right Thumb Fracture  S62.521A  POST-OPERATIVE DIAGNOSIS:  Left Distal Radius Fracture  S52.572A  Right Thumb Fracture   PROCEDURE:  Procedure(s): OPEN REDUCTION INTERNAL FIXATION (ORIF) LEFT DISTAL RADIAL FRACTURE (Left) OPEN REDUCTION DISTAL PHALANX (Right) with excision of bone fragments  SURGEON:  Surgeon(s) and Role:    * Betha LoaKevin Mccabe Gloria, MD - Primary    * Cindee SaltGary Ivanna Kocak, MD - Assisting  PHYSICIAN ASSISTANT:   ASSISTANTS: Cindee SaltGary Nasiah Lehenbauer, MD   ANESTHESIA:   regional and general  EBL:  Total I/O In: 1600 [I.V.:1600] Out: 25 [Blood:25]  BLOOD ADMINISTERED:none  DRAINS: none   LOCAL MEDICATIONS USED:  MARCAINE     SPECIMEN:  No Specimen  DISPOSITION OF SPECIMEN:  N/A  COUNTS:  YES  TOURNIQUET:   Total Tourniquet Time Documented: Upper Arm (Left) - 100 minutes Total: Upper Arm (Left) - 100 minutes  Forearm (Right) - 22 minutes Total: Forearm (Right) - 22 minutes   DICTATION: .Note written in EPIC  PLAN OF CARE: Discharge to home after PACU  PATIENT DISPOSITION:  PACU - hemodynamically stable.

## 2016-09-13 NOTE — Progress Notes (Signed)
Assisted Dr. Carswell Jackson with left, axillary block. Side rails up, monitors on throughout procedure. See vital signs in flow sheet. Tolerated Procedure well. 

## 2016-09-13 NOTE — Anesthesia Procedure Notes (Signed)
Anesthesia Regional Block:  Axillary brachial plexus block  Pre-Anesthetic Checklist: ,, timeout performed, Correct Patient, Correct Site, Correct Laterality, Correct Procedure, Correct Position, site marked, Risks and benefits discussed,  Surgical consent,  Pre-op evaluation,  At surgeon's request and post-op pain management  Laterality: Left and Upper  Prep: chloraprep       Needles:   Needle Type: Other   (short "B" bevel)   Needle Length: 4cm 4 cm Needle Gauge: 22 and 22 G    Additional Needles:  Procedures: paresthesia technique Axillary brachial plexus block  Nerve Stimulator or Paresthesia:  Response: transient median nerve paresthesia,  Response: transient ulnar nerve paresthesia,   Additional Responses:   Narrative:  Start time: 09/13/2016 3:40 PM End time: 09/13/2016 3:49 PM Injection made incrementally with aspirations every 5 mL.  Performed by: Personally  Anesthesiologist: Jean RosenthalJACKSON, Glenetta Kiger  Additional Notes: Pt identified in Holding room.  Monitors applied. Working IV access confirmed. Sterile prep L axilla.  #22ga PNS short "B" bevel to transient median and ulnar nerve paresthesiae.  Total 30cc 0.5% Bupivacaine with 1:200k epi injected incrementally after negative test dose, distributed around each paresthesia.  Patient asymptomatic, VSS, no heme aspirated, tolerated well.  Sandford Craze Philmore Lepore, MD
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
## 2016-09-13 NOTE — Discharge Instructions (Addendum)
Call your surgeon if you experience:  ° °1.  Fever over 101.0. °2.  Inability to urinate. °3.  Nausea and/or vomiting. °4.  Extreme swelling or bruising at the surgical site. °5.  Continued bleeding from the incision. °6.  Increased pain, redness or drainage from the incision. °7.  Problems related to your pain medication. °8.  Any problems and/or concerns °Post Anesthesia Home Care Instructions ° °Activity: °Get plenty of rest for the remainder of the day. A responsible adult should stay with you for 24 hours following the procedure.  °For the next 24 hours, DO NOT: °-Drive a car °-Operate machinery °-Drink alcoholic beverages °-Take any medication unless instructed by your physician °-Make any legal decisions or sign important papers. ° °Meals: °Start with liquid foods such as gelatin or soup. Progress to regular foods as tolerated. Avoid greasy, spicy, heavy foods. If nausea and/or vomiting occur, drink only clear liquids until the nausea and/or vomiting subsides. Call your physician if vomiting continues. ° °Special Instructions/Symptoms: °Your throat may feel dry or sore from the anesthesia or the breathing tube placed in your throat during surgery. If this causes discomfort, gargle with warm salt water. The discomfort should disappear within 24 hours. ° °If you had a scopolamine patch placed behind your ear for the management of post- operative nausea and/or vomiting: ° °1. The medication in the patch is effective for 72 hours, after which it should be removed.  Wrap patch in a tissue and discard in the trash. Wash hands thoroughly with soap and water. °2. You may remove the patch earlier than 72 hours if you experience unpleasant side effects which may include dry mouth, dizziness or visual disturbances. °3. Avoid touching the patch. Wash your hands with soap and water after contact with the patch. °  ° ° °[facility] Instructions °Hand Surgery ° °Wound Care: °Keep your hand elevated above the level of your  heart.  Do not allow it to dangle by your side.  Keep the dressing dry and do not remove it unless your doctor advises you to do so.  He will usually change it at the time of your post-op visit.  Moving your fingers is advised to stimulate circulation but will depend on the site of your surgery.  If you have a splint applied, your doctor will advise you regarding movement. ° °Activity: °Do not drive or operate machinery today.  Rest today and then you may return to your normal activity and work as indicated by your physician. ° °Diet:  °Drink liquids today or eat a light diet.  You may resume a regular diet tomorrow.   ° °General expectations: °Pain for two to three days. °Fingers may become slightly swollen. ° °Call your doctor if any of the following occur: °Severe pain not relieved by pain medication. °Elevated temperature. °Dressing soaked with blood. °Inability to move fingers. °White or bluish color to fingers. ° °
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
## 2016-09-13 NOTE — Anesthesia Preprocedure Evaluation (Addendum)
Anesthesia Evaluation  Patient identified by MRN, date of birth, ID band Patient awake    Reviewed: Allergy & Precautions, NPO status , Patient's Chart, lab work & pertinent test results  History of Anesthesia Complications Negative for: history of anesthetic complications  Airway Mallampati: II  TM Distance: >3 FB Neck ROM: Full    Dental  (+) Loose, Dental Advisory Given, Poor Dentition, Missing   Pulmonary Current Smoker,    breath sounds clear to auscultation       Cardiovascular negative cardio ROS   Rhythm:Regular Rate:Normal     Neuro/Psych negative neurological ROS     GI/Hepatic negative GI ROS, Neg liver ROS,   Endo/Other  negative endocrine ROS  Renal/GU negative Renal ROS     Musculoskeletal   Abdominal   Peds  Hematology negative hematology ROS (+)   Anesthesia Other Findings   Reproductive/Obstetrics                            Anesthesia Physical Anesthesia Plan  ASA: II  Anesthesia Plan: General   Post-op Pain Management: GA combined w/ Regional for post-op pain   Induction: Intravenous  Airway Management Planned: LMA  Additional Equipment:   Intra-op Plan:   Post-operative Plan:   Informed Consent: I have reviewed the patients History and Physical, chart, labs and discussed the procedure including the risks, benefits and alternatives for the proposed anesthesia with the patient or authorized representative who has indicated his/her understanding and acceptance.   Dental advisory given  Plan Discussed with: CRNA and Surgeon  Anesthesia Plan Comments: (Plan routine monitors, GA- LMA OK, axillary block for post op analgesia)        Anesthesia Quick Evaluation

## 2016-09-13 NOTE — Op Note (Signed)
I assisted Surgeon(s) and Role:    * Betha LoaKevin Aalliyah Kilker Bultman, MD - Primary    * Cindee SaltGary Jaegar Croft, MD - Assisting on the Procedure(s): OPEN REDUCTION INTERNAL FIXATION (ORIF) LEFT DISTAL RADIAL FRACTURE OPEN REDUCTION INTERNAL FIXATION (ORIF) DISTAL PHALANX, AND PINNING on 09/13/2016.  I provided assistance on this case as follows: approach, retraction isolation of the radius fracture, debridement of the fracture, reduction of the fracture, stabilization and fixation of the fracture, application of the plate and screws, closure and application of the dressing and splint. Treatment of the thumb. I was present for the entire case.  Electronically signed by: Nicki ReaperKUZMA,Yahsir Wickens R, MD Date: 09/13/2016 Time: 6:29 PM

## 2016-09-13 NOTE — Anesthesia Procedure Notes (Signed)
Procedure Name: LMA Insertion Performed by: Liberato Stansbery W Pre-anesthesia Checklist: Patient identified, Emergency Drugs available, Suction available and Patient being monitored Patient Re-evaluated:Patient Re-evaluated prior to inductionOxygen Delivery Method: Circle system utilized Preoxygenation: Pre-oxygenation with 100% oxygen Intubation Type: IV induction Ventilation: Mask ventilation without difficulty LMA: LMA inserted LMA Size: 4.0 Number of attempts: 1 Placement Confirmation: positive ETCO2 Tube secured with: Tape Dental Injury: Teeth and Oropharynx as per pre-operative assessment        

## 2016-09-13 NOTE — Anesthesia Postprocedure Evaluation (Signed)
Anesthesia Post Note  Patient: Logan Lee  Procedure(s) Performed: Procedure(s) (LRB): OPEN REDUCTION INTERNAL FIXATION (ORIF) LEFT DISTAL RADIAL FRACTURE (Left) OPEN REDUCTION DISTAL PHALANX (Right)  Patient location during evaluation: PACU Anesthesia Type: General and Regional Level of consciousness: awake and alert Pain management: pain level controlled Vital Signs Assessment: post-procedure vital signs reviewed and stable Respiratory status: spontaneous breathing, nonlabored ventilation, respiratory function stable and patient connected to nasal cannula oxygen Cardiovascular status: blood pressure returned to baseline and stable Postop Assessment: no signs of nausea or vomiting Anesthetic complications: no       Last Vitals:  Vitals:   09/13/16 1553 09/13/16 1836  BP:  133/74  Pulse: 71 73  Resp: 16 16  Temp:  36.5 C    Last Pain:  Vitals:   09/13/16 1246  TempSrc: Oral  PainSc:                  Laneta Guerin S

## 2016-09-13 NOTE — Transfer of Care (Signed)
Immediate Anesthesia Transfer of Care Note  Patient: Colbert CoyerJesus B Shreiner  Procedure(s) Performed: Procedure(s): OPEN REDUCTION INTERNAL FIXATION (ORIF) LEFT DISTAL RADIAL FRACTURE (Left) OPEN REDUCTION INTERNAL FIXATION (ORIF) DISTAL PHALANX, AND PINNING (Right)  Patient Location: PACU  Anesthesia Type:General  Level of Consciousness: awake and sedated  Airway & Oxygen Therapy: Patient Spontanous Breathing and Patient connected to face mask oxygen  Post-op Assessment: Report given to RN and Post -op Vital signs reviewed and stable  Post vital signs: Reviewed and stable  Last Vitals:  Vitals:   09/13/16 1552 09/13/16 1553  BP: 117/77   Pulse: 69 71  Resp: 15 16  Temp:      Last Pain:  Vitals:   09/13/16 1246  TempSrc: Oral  PainSc:          Complications: No apparent anesthesia complications

## 2016-09-14 ENCOUNTER — Encounter (HOSPITAL_BASED_OUTPATIENT_CLINIC_OR_DEPARTMENT_OTHER): Payer: Self-pay | Admitting: Orthopedic Surgery

## 2016-09-14 NOTE — Addendum Note (Signed)
Addendum  created 09/14/16 1408 by Lance CoonWesley Rafe Mackowski, CRNA   Charge Capture section accepted

## 2017-01-21 NOTE — Addendum Note (Signed)
Addendum  created 01/21/17 1024 by Leita Lindbloom, MD   Sign clinical note    

## 2017-01-21 NOTE — Anesthesia Postprocedure Evaluation (Signed)
Anesthesia Post Note  Patient: Logan Lee  Procedure(s) Performed: Procedure(s) (LRB): OPEN REDUCTION INTERNAL FIXATION (ORIF) LEFT DISTAL RADIAL FRACTURE (Left) OPEN REDUCTION DISTAL PHALANX (Right)     Anesthesia Post Evaluation  Last Vitals:  Vitals:   09/13/16 1915 09/13/16 1928  BP: (!) 159/89 (!) 149/69  Pulse: 69 86  Resp: 11 18  Temp:  36.7 C    Last Pain:  Vitals:   09/13/16 1928  TempSrc: Oral  PainSc: 0-No pain                 Nimrit Kehres S

## 2017-10-02 ENCOUNTER — Other Ambulatory Visit: Payer: Self-pay

## 2017-10-02 ENCOUNTER — Emergency Department (HOSPITAL_COMMUNITY)
Admission: EM | Admit: 2017-10-02 | Discharge: 2017-10-03 | Disposition: A | Discharge status: Home / Self Care | Payer: Self-pay | Attending: Emergency Medicine | Admitting: Emergency Medicine

## 2017-10-02 ENCOUNTER — Encounter (HOSPITAL_COMMUNITY): Payer: Self-pay

## 2017-10-02 DIAGNOSIS — F17210 Nicotine dependence, cigarettes, uncomplicated: Secondary | ICD-10-CM | POA: Insufficient documentation

## 2017-10-02 DIAGNOSIS — F10920 Alcohol use, unspecified with intoxication, uncomplicated: Secondary | ICD-10-CM | POA: Insufficient documentation

## 2017-10-02 LAB — ETHANOL: Alcohol, Ethyl (B): 379 mg/dL (ref <10)

## 2017-10-02 MED ORDER — SODIUM CHLORIDE 0.9 % IV BOLUS (SEPSIS)
1000.0000 mL | Freq: Once | INTRAVENOUS | Status: AC
  Administered 2017-10-02: 1000 mL via INTRAVENOUS

## 2017-10-02 MED ORDER — THIAMINE HCL 100 MG/ML IJ SOLN
Freq: Once | INTRAVENOUS | Status: AC
  Administered 2017-10-02: 23:00:00 via INTRAVENOUS
  Filled 2017-10-02: qty 1000

## 2017-10-02 NOTE — ED Triage Notes (Signed)
EMS VS 110/67 RR16 O2 sat 99% CBG 119 "18 angio right hand

## 2017-10-02 NOTE — ED Notes (Signed)
Bed: [facility] Expected date:  Expected time:  Means of arrival:  Comments: 

## 2017-10-02 NOTE — ED Provider Notes (Signed)
[facility], [place], at shift change, please see their notes for full documentation of patient's complaint/HPI. Briefly, pt here with intoxication. Awaiting EtOH level and to have him finish fluids and get clinically sober. Plan is to await these to occur and reassess.    Physical Exam  BP 129/71   Pulse 69   Temp 98 F (36.7 C) (Oral)   Resp 18   SpO2 99%    Physical Exam Gen: afebrile, VSS, NAD, sleeping but easily arousable, coherent speech HEENT: EOMI, MMM Resp: no resp distress CV: rate WNL Abd: appearance normal, nondistended MsK: moving all extremities with ease, ambulatory with steady gait Neuro: A&O x3  ED Course/Procedures    Results for orders placed or performed during the hospital encounter of 10/02/17  Ethanol  Result Value Ref Range   Alcohol, Ethyl (B) 379 (HH) <10 mg/dL    Meds ordered this encounter  Medications  . sodium chloride 0.9 % bolus 1,000 mL  . sodium chloride 0.9 % 1,000 mL with thiamine 100 mg, folic acid 1 mg, multivitamins adult 10 mL infusion     MDM:   ICD-10-CM   1. Alcoholic intoxication without complication (HCC) F10.920     6:49 AM EtOH level 379 at 10pm. Pt now clinically sober, ambulatory with steady gait. He will call a ride to pick him up. He denies any complaints at this time and his vitals are stable and he remains in NAD. Advised alcohol cessation. F/up with PCP for ongoing management of alcoholism. Outpatient resources also given. I explained the diagnosis and have given explicit precautions to return to the ER including for any other new or worsening symptoms. The patient understands and accepts the medical plan as it's been dictated and I have answered their questions. Discharge instructions concerning home care and prescriptions have been given. The patient is STABLE and is discharged to home in good condition.    [address], [place], [place] 10/03/17 [id number]    Lorre NickAllen, Anthony, MD 10/03/17 1322
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
## 2017-10-02 NOTE — ED Notes (Signed)
Date and time results received: 10/02/17 2311 (use smartphrase ".now" to insert current time)  Test: alcohol Critical Value: 379  Name of Provider Notified: Alyssa PA  Orders Received? Or Actions Taken?: No new order given

## 2017-10-02 NOTE — ED Provider Notes (Signed)
[facility]-EMERGENCY DEPT Provider Note   CSN: [id number] Arrival date & time: 10/02/17  2051     History   Chief Complaint Chief Complaint  Patient presents with  . Alcohol Intoxication    HPI Logan Lee is a 42 y.o. male.  HPI  Patient is a 42 year old male with  no significant past medical history presenting for alcohol intoxication.  Per [facility], friends called due to patient urinating on himself in the setting of intoxication.  Patient has a history presenting for intoxication.  Patient denies any trauma, falls, head pain, neck pain, chest pain, shortness of breath.  Patient cannot recall specific events of the evening or how much alcohol he consumed this evening.  Patient denies recalling friends calling EMS.  Patient does report that he is able to get a ride home this evening.  History obtained with the assistance of Spanish interpreter, Logan Lee [phone].  Level 5 caveat intoxication, as patient is only answering yes no questions at this time.  Past Medical History:  Diagnosis Date  . Distal radius fracture, left 08/18/2016  . Thumb fracture 08/18/2016   right    There are no active problems to display for this patient.   Past Surgical History:  Procedure Laterality Date  . NO PAST SURGERIES    . OPEN REDUCTION INTERNAL FIXATION (ORIF) DISTAL PHALANX Right 09/13/2016   Procedure: OPEN REDUCTION DISTAL PHALANX;  Surgeon: Betha Loa, MD;  Location: [facility];  Service: Orthopedics;  Laterality: Right;  . OPEN REDUCTION INTERNAL FIXATION (ORIF) DISTAL RADIAL FRACTURE Left 09/13/2016   Procedure: OPEN REDUCTION INTERNAL FIXATION (ORIF) LEFT DISTAL RADIAL FRACTURE;  Surgeon: Betha Loa, MD;  Location: [facility];  Service: Orthopedics;  Laterality: Left;       Home Medications    Prior to Admission medications   Medication Sig Start Date End Date Taking? Authorizing Provider  ibuprofen (ADVIL,MOTRIN) 600 MG tablet  Take 1 tablet (600 mg total) by mouth every 6 (six) hours as needed. 08/19/16   Antony Madura, PA-C  oxyCODONE-acetaminophen (PERCOCET) 5-325 MG tablet 1-2 tabs PO q6 hours prn pain 09/13/16   Betha Loa, MD    Family History No family history on file.  Social History Social History   Tobacco Use  . Smoking status: Current Some Day Smoker    Packs/day: 0.00    Types: Cigarettes    Last attempt to quit: 09/05/2016    Years since quitting: 1.0  . Smokeless tobacco: Never Used  . Tobacco comment: one to two cigerettes a week  Substance Use Topics  . Alcohol use: Yes    Comment: occasionally  . Drug use: No     Allergies   Patient has no known allergies.   Review of Systems Review of Systems  Cardiovascular: Negative for chest pain.  Gastrointestinal: Negative for abdominal pain.  Musculoskeletal: Negative for back pain.  Neurological: Negative for syncope and headaches.   Level 5 caveat intoxication.  Physical Exam Updated Vital Signs BP 125/86 (BP Location: Right Arm)   Pulse (!) 108   Temp 98 F (36.7 C) (Oral)   Resp 18   SpO2 98%   Physical Exam  Constitutional: He appears well-developed and well-nourished. No distress.  Appearing confused and only answering to yes no questions with occasional clarifications.  HENT:  Head: Normocephalic and atraumatic.  Mouth/Throat: Oropharynx is clear and moist.  Eyes: Conjunctivae and EOM are normal. Pupils are equal, round, and reactive to light.  Neck:  Normal range of motion. Neck supple.  Cardiovascular: Normal rate, regular rhythm, S1 normal and S2 normal.  No murmur heard. Pulmonary/Chest: Effort normal and breath sounds normal. He has no wheezes. He has no rales.  Abdominal: Soft. He exhibits no distension. There is no tenderness. There is no guarding.  Musculoskeletal: Normal range of motion. He exhibits no edema or deformity.  Lymphadenopathy:    He has no cervical adenopathy.  Neurological: He is alert.    Cranial nerves grossly intact. Patient moves extremities symmetrically and with good coordination. Patient is following commands and responds appropriately with yes and no.  Speech is fluent when patient makes spontaneous statements.  Skin: Skin is warm and dry. No rash noted. No erythema.  Nursing note and vitals reviewed.    ED Treatments / Results  Labs (all labs ordered are listed, but only abnormal results are displayed) Labs Reviewed  ETHANOL - Abnormal; Notable for the following components:      Result Value   Alcohol, Ethyl (B) 379 (*)    All other components within normal limits    EKG  EKG Interpretation None       Radiology No results found.  Procedures Procedures (including critical care time)  Medications Ordered in ED Medications  sodium chloride 0.9 % bolus 1,000 mL (0 mLs Intravenous Stopped 10/02/17 2251)  sodium chloride 0.9 % 1,000 mL with thiamine 100 mg, folic acid 1 mg, multivitamins adult 10 mL infusion ( Intravenous New Bag/Given 10/02/17 2250)     Initial Impression / Assessment and Plan / ED Course  I have reviewed the triage vital signs and the nursing notes.  Pertinent labs & imaging results that were available during my care of the patient were reviewed by me and considered in my medical decision making (see chart for details).     Patient is nontoxic-appearing but intoxicated.  Alcohol level 379.  Patient is receiving 1 L of normal saline and additional fluid repletion with thiamine, folic acid, multivitamins.  Patient is maintaining airway and improving mentation with fluid repletion.  Anticipate patient able to make safe discharge once clinically sober.  There is no evidence of trauma on examination. Tachycardia improving with fluid repletion.  Due to patient's attempt to get out of the bed and unsteadiness on his feet due to intoxication, RN who requested four-point soft restraints for patient safety.  Patient evaluated immediately after  placement and in no acute distress.  Removed at the earliest possible time, as patient was progressing towards sobriety.  Patient care signed out [facility], PA-C to monitor patient and discharge when volume repleted and sober.   Final Clinical Impressions(s) / ED Diagnoses   Final diagnoses:  Alcoholic intoxication without complication [facility](HCC)    ED Discharge Orders    None       Delia ChimesMurray, Alyssa B, PA-C 10/03/17 0033    Lorre NickAllen, Anthony, MD 10/03/17 1322

## 2017-10-03 NOTE — ED Notes (Signed)
No respiratory or acute distress noted resting in bed with eyes closed. 

## 2017-10-03 NOTE — ED Notes (Signed)
CALL FOR A RIDE HOME. DISCHARGE INSTRUCTIONS GIVEN BY BRAIN RN

## 2017-10-03 NOTE — ED Notes (Signed)
RIDE PRESENT

## 2017-10-03 NOTE — ED Notes (Signed)
No respiratory or acute distress noted alert and talking resting in bed.

## 2017-10-03 NOTE — ED Notes (Signed)
No respiratory or acute distress noted resting in bed with eyes closed no reaction to medication noted.

## 2017-10-03 NOTE — Discharge Instructions (Addendum)
Please see the information and instructions below regarding your visit. STOP DRINKING ALCOHOL! Follow up with your regular doctor for ongoing management of your alcoholism. See the list of resources below to find help with detox and alcohol use. Return to the ER for emergent changes or worsening symptoms.   Your diagnoses today include:  1. Alcoholic intoxication without complication (HCC)    You were seen today for alcohol use. We gave you fluids to help you metabolize alcohol.  Tests performed today include: See side panel of your discharge paperwork for testing performed today. Vital signs are listed at the bottom of these instructions.   Medications prescribed:    Take any prescribed medications only as prescribed, and any over the counter medications only as directed on the packaging.  Home care instructions:  Please follow any educational materials contained in this packet.   Please drink lots of fluids when you get home. Your urine should be light yellow.  Follow-up instructions: Please follow-up with your primary care provider as soon as possible for further assistance with alcohol use.   Return instructions:  Please return to the Emergency Department if you experience worsening symptoms.  Regrese si tiene Kohl'sdolor en el pecho, dificultad para respirar, mareos, aturdimiento, sensacin de que se va a desmayar, o nuseas o vmitos que le impiden mantener alimentos o lquidos. Please return if you have any other emergent concerns.  Additional Information:  Your vital signs today were: BP 129/71    Pulse 69    Temp 98 F (36.7 C) (Oral)    Resp 18    SpO2 99%  If your blood pressure (BP) was elevated on multiple readings during this visit above 130 for the top number or above 80 for the bottom number, please have this repeated by your primary care provider within one month. --------------  Thank you for allowing us to participate in your care today.

## 2017-10-23 ENCOUNTER — Encounter (HOSPITAL_COMMUNITY): Payer: Self-pay | Admitting: Emergency Medicine

## 2017-10-23 ENCOUNTER — Other Ambulatory Visit: Payer: Self-pay

## 2017-10-23 ENCOUNTER — Emergency Department (HOSPITAL_COMMUNITY): Payer: Self-pay

## 2017-10-23 ENCOUNTER — Emergency Department (HOSPITAL_COMMUNITY)
Admission: EM | Admit: 2017-10-23 | Discharge: 2017-10-24 | Disposition: A | Discharge status: Home / Self Care | Payer: Self-pay | Attending: Emergency Medicine | Admitting: Emergency Medicine

## 2017-10-23 DIAGNOSIS — F17210 Nicotine dependence, cigarettes, uncomplicated: Secondary | ICD-10-CM | POA: Insufficient documentation

## 2017-10-23 DIAGNOSIS — F10920 Alcohol use, unspecified with intoxication, uncomplicated: Secondary | ICD-10-CM | POA: Insufficient documentation

## 2017-10-23 DIAGNOSIS — R400 Somnolence: Secondary | ICD-10-CM | POA: Insufficient documentation

## 2017-10-23 DIAGNOSIS — Y908 Blood alcohol level of 240 mg/100 ml or more: Secondary | ICD-10-CM | POA: Insufficient documentation

## 2017-10-23 LAB — COMPREHENSIVE METABOLIC PANEL
ALT: 17 U/L (ref 17–63)
ANION GAP: 10 (ref 5–15)
AST: 22 U/L (ref 15–41)
Albumin: 4.2 g/dL (ref 3.5–5.0)
Alkaline Phosphatase: 59 U/L (ref 38–126)
BILIRUBIN TOTAL: 0.3 mg/dL (ref 0.3–1.2)
BUN: 7 mg/dL (ref 6–20)
CO2: 24 mmol/L (ref 22–32)
Calcium: 8.7 mg/dL — ABNORMAL LOW (ref 8.9–10.3)
Chloride: 111 mmol/L (ref 101–111)
Creatinine, Ser: 0.56 mg/dL — ABNORMAL LOW (ref 0.61–1.24)
GFR calc Af Amer: >60 mL/min (ref >60)
Glucose, Bld: 99 mg/dL (ref 65–99)
POTASSIUM: 3.3 mmol/L — AB (ref 3.5–5.1)
Sodium: 145 mmol/L (ref 135–145)
TOTAL PROTEIN: 7.2 g/dL (ref 6.5–8.1)

## 2017-10-23 LAB — CBC WITH DIFFERENTIAL/PLATELET
Basophils Absolute: 0 10*3/uL (ref 0.0–0.1)
Basophils Relative: 1 %
Eosinophils Absolute: 0 10*3/uL (ref 0.0–0.7)
Eosinophils Relative: 0 %
HEMATOCRIT: 43.7 % (ref 39.0–52.0)
Hemoglobin: 15.1 g/dL (ref 13.0–17.0)
LYMPHS PCT: 39 %
Lymphs Abs: 1.4 10*3/uL (ref 0.7–4.0)
MCH: 32.1 pg (ref 26.0–34.0)
MCHC: 34.6 g/dL (ref 30.0–36.0)
MCV: 93 fL (ref 78.0–100.0)
MONO ABS: 0.1 10*3/uL (ref 0.1–1.0)
MONOS PCT: 2 %
Neutro Abs: 2.1 10*3/uL (ref 1.7–7.7)
Neutrophils Relative %: 58 %
Platelets: 359 10*3/uL (ref 150–400)
RBC: 4.7 MIL/uL (ref 4.22–5.81)
RDW: 13 % (ref 11.5–15.5)
WBC: 3.6 10*3/uL — ABNORMAL LOW (ref 4.0–10.5)

## 2017-10-23 LAB — LIPASE, BLOOD: LIPASE: 22 U/L (ref 11–51)

## 2017-10-23 LAB — ETHANOL: Alcohol, Ethyl (B): 317 mg/dL (ref <10)

## 2017-10-23 MED ORDER — SODIUM CHLORIDE 0.9 % IV BOLUS (SEPSIS)
1000.0000 mL | Freq: Once | INTRAVENOUS | Status: AC
  Administered 2017-10-23: 1000 mL via INTRAVENOUS

## 2017-10-23 NOTE — ED Notes (Signed)
Pt ambulated to the bathroom and back without assistance  

## 2017-10-23 NOTE — ED Provider Notes (Signed)
[facility]-EMERGENCY DEPT Provider Note   CSN: [id number] Arrival date & time: 10/23/17  1751     History   Chief Complaint Chief Complaint  Patient presents with  . Alcohol Intoxication    HPI Logan Lee is a 42 y.o. male.  LVL 5 caveat for AMS and likely intoxication  The history is provided by medical records. The history is limited by the condition of the patient and a language barrier. A language interpreter was used.  Altered Mental Status   This is a recurrent problem. The current episode started less than 1 hour ago. The problem has been gradually worsening. Associated symptoms include somnolence. Risk factors include alcohol intake. His past medical history does not include diabetes, CVA, hypertension, COPD or dementia.    Past Medical History:  Diagnosis Date  . Distal radius fracture, left 08/18/2016  . Thumb fracture 08/18/2016   right    There are no active problems to display for this patient.   Past Surgical History:  Procedure Laterality Date  . NO PAST SURGERIES    . OPEN REDUCTION INTERNAL FIXATION (ORIF) DISTAL PHALANX Right 09/13/2016   Procedure: OPEN REDUCTION DISTAL PHALANX;  Surgeon: Logan LoaKevin Kuzma, MD;  Location: [facility];  Service: Orthopedics;  Laterality: Right;  . OPEN REDUCTION INTERNAL FIXATION (ORIF) DISTAL RADIAL FRACTURE Left 09/13/2016   Procedure: OPEN REDUCTION INTERNAL FIXATION (ORIF) LEFT DISTAL RADIAL FRACTURE;  Surgeon: Logan LoaKevin Kuzma, MD;  Location: [facility];  Service: Orthopedics;  Laterality: Left;       Home Medications    Prior to Admission medications   Medication Sig Start Date End Date Taking? Authorizing Provider  oxyCODONE-acetaminophen (PERCOCET) 5-325 MG tablet 1-2 tabs PO q6 hours prn pain Patient not taking: Reported on 10/03/2017 09/13/16   Logan LoaKuzma, Kevin, MD    Family History History reviewed. No pertinent family history.  Social History Social History    Tobacco Use  . Smoking status: Current Some Day Smoker    Packs/day: 0.00    Types: Cigarettes    Last attempt to quit: 09/05/2016    Years since quitting: 1.1  . Smokeless tobacco: Never Used  . Tobacco comment: one to two cigerettes a week  Substance Use Topics  . Alcohol use: Yes    Comment: occasionally  . Drug use: No     Allergies   Patient has no known allergies.   Review of Systems Review of Systems  Unable to perform ROS: Mental status change     Physical Exam Updated Vital Signs BP 102/63 (BP Location: Left Arm)   Pulse 90   Temp 98 F (36.7 C) (Oral)   Resp 18   SpO2 97%   Physical Exam  Constitutional: He appears well-developed and well-nourished. No distress.  HENT:  Head: Normocephalic.  Mouth/Throat: Oropharynx is clear and moist. No oropharyngeal exudate.  Eyes: Conjunctivae are normal. Pupils are equal, round, and reactive to light.  Cardiovascular: Normal rate and intact distal pulses.  No murmur heard. Pulmonary/Chest: Effort normal. No stridor. No respiratory distress. He has no wheezes. He exhibits no tenderness.  Abdominal: Soft. He exhibits no distension. There is no tenderness.  Musculoskeletal: He exhibits no edema or tenderness.  Neurological: He displays no tremor. GCS eye subscore is 3. GCS verbal subscore is 3. GCS motor subscore is 5.  Somnolent. Responds to painful stimuli. Moves all extremities.   Skin: Capillary refill takes less than 2 seconds. He is not diaphoretic. No erythema. No  pallor.  Nursing note and vitals reviewed.    ED Treatments / Results  Labs (all labs ordered are listed, but only abnormal results are displayed) Labs Reviewed  CBC WITH DIFFERENTIAL/PLATELET - Abnormal; Notable for the following components:      Result Value   WBC 3.6 (*)    All other components within normal limits  COMPREHENSIVE METABOLIC PANEL - Abnormal; Notable for the following components:   Potassium 3.3 (*)    Creatinine, Ser 0.56  (*)    Calcium 8.7 (*)    All other components within normal limits  ETHANOL - Abnormal; Notable for the following components:   Alcohol, Ethyl (B) 317 (*)    All other components within normal limits  LIPASE, BLOOD  RAPID URINE DRUG SCREEN, HOSP PERFORMED  URINALYSIS, ROUTINE W REFLEX MICROSCOPIC    EKG  EKG Interpretation  Date/Time:  Wednesday October 23 2017 21:27:39 EST Ventricular Rate:  65 PR Interval:  170 QRS Duration: 82 QT Interval:  378 QTC Calculation: 393 R Axis:   76 Text Interpretation:  Normal sinus rhythm Normal ECG When compared to prior, no significant changes seen.  No STEMI Confirmed by Logan Lee ([id number]) on 10/23/2017 9:36:24 PM       Radiology Dg Chest 2 View  Result Date: 10/23/2017 CLINICAL DATA:  Unresponsive EXAM: CHEST - 2 VIEW COMPARISON:  None. FINDINGS: The heart size and mediastinal contours are within normal limits. Both lungs are clear. The visualized skeletal structures are unremarkable. IMPRESSION: No active cardiopulmonary disease. Electronically Signed   By: Jasmine Pang M.D.   On: 10/23/2017 20:28   Ct Head Wo Contrast  Result Date: 10/23/2017 CLINICAL DATA:  42 year old male currently intoxicated and accompanied by police. EXAM: CT HEAD WITHOUT CONTRAST TECHNIQUE: Contiguous axial images were obtained from the base of the skull through the vertex without intravenous contrast. COMPARISON:  Prior head CT 05/24/2015 FINDINGS: Brain: No evidence of acute infarction, hemorrhage, hydrocephalus, extra-axial collection or mass lesion/mass effect. Age advanced cerebral volume loss. Vascular: No hyperdense vessel or unexpected calcification. Skull: Normal. Negative for fracture or focal lesion. Sinuses/Orbits: No acute finding. Other: None. IMPRESSION: 1. No acute intracranial abnormality. 2. Age advanced cortical volume loss. Electronically Signed   By: Logan Lee M.D.   On: 10/23/2017 20:36    Procedures Procedures (including critical care  time)  Medications Ordered in ED Medications  sodium chloride 0.9 % bolus 1,000 mL (1,000 mLs Intravenous New Bag/Given 10/23/17 1954)     Initial Impression / Assessment and Plan / ED Course  I have reviewed the triage vital signs and the nursing notes.  Pertinent labs & imaging results that were available during my care of the patient were reviewed by me and considered in my medical decision making (see chart for details).     Logan Lee is a 42 y.o. male with a past medical history of alcohol abuse and intoxication visit  to the ED who presents for altered mental status and intoxication per [employer].  According to nursing report from EMS, patient was found in the parking lot of [employer] with a bottle of alcohol and was acting agitated.  There is no report of trauma however upon my initial evaluation, patient is somnolent and is not verbal.  Patient does smell of alcohol on my initial evaluation.  Patient would not answer any questions with the interpreter.  Patient does open his eyes and move when sternal rub however.    On exam, patient's lungs were  clear.  No evidence of trauma on initial exam.  Patient moves all extremities with sternal rub and opens his eyes.  Clinically, patient appears intoxicated.    Although I suspect intoxication given the clinical report and exam, I cannot rule out altered mental status is from other injuries, substance ingestion, or infection.  Patient will have imaging of his head, chest x-ray, urinalysis, and lab testing while he is being rehydrated and metabolizing the likely alcohol.    Anticipate reassessment.   12:33 AM Patient woke up was able to answer questions properly.  Patient was able to ambulate without difficulty or concern for injuring himself.  Urinalysis showed no evidence of infection and UDS reassuring.  Alcohol was elevated at 317.  This is reassuring as to the likely etiology of his altered mental status and intoxication.  Laboratory testing  otherwise reassuring.  CT head showed no acute intracranial abnormality and chest x-ray shows no pneumonia or evidence of aspiration.  Given patient's improvement and clinical sobriety and his ability to ambulate, patient was felt stable for discharge home.  Patient was able to tolerate drinking without nausea or vomiting.  Patient will be discharged for further metabolism at home.  Patient is awaiting someone else to pick him up as a ride as he is not safe to drive home.   Final Clinical Impressions(s) / ED Diagnoses   Final diagnoses:  Alcoholic intoxication without complication (HCC)  Somnolence    ED Discharge Orders    None      Clinical Impression: 1. Alcoholic intoxication without complication (HCC)   2. Somnolence     Disposition: Discharge  Condition: Good  I have discussed the results, Dx and Tx plan with the pt(& family if present). He/she/they expressed understanding and agree(s) with the plan. Discharge instructions discussed at great length. Strict return precautions discussed and pt &/or family have verbalized understanding of the instructions. No further questions at time of discharge.    New Prescriptions   No medications on file    Follow Up: Lavinia Sharps, NP [address] [place] [address] [phone]     [facility] [address] [id number] mc [address] [place] [address] [phone]       Tegeler, Canary Brim, MD 10/24/17 [phone]

## 2017-10-23 NOTE — ED Triage Notes (Signed)
Pt from [employer] parking lot with alcohol intoxication with bottle of alcohol, uncooperative and accompanied by police and combative.

## 2017-10-24 LAB — URINALYSIS, ROUTINE W REFLEX MICROSCOPIC
BILIRUBIN URINE: NEGATIVE
Glucose, UA: NEGATIVE mg/dL
HGB URINE DIPSTICK: NEGATIVE
Ketones, ur: NEGATIVE mg/dL
Leukocytes, UA: NEGATIVE
Nitrite: NEGATIVE
PH: 5 (ref 5.0–8.0)
Protein, ur: NEGATIVE mg/dL
SPECIFIC GRAVITY, URINE: 1.015 (ref 1.005–1.030)

## 2017-10-24 LAB — RAPID URINE DRUG SCREEN, HOSP PERFORMED
AMPHETAMINES: NOT DETECTED
BENZODIAZEPINES: NOT DETECTED
Barbiturates: NOT DETECTED
Cocaine: NOT DETECTED
Opiates: NOT DETECTED
TETRAHYDROCANNABINOL: NOT DETECTED

## 2017-10-24 NOTE — Discharge Instructions (Signed)
Your workup today did not show evidence of infection or injuries.  You were intoxicated   As the likely cause of your altered mental status.  Please follow-up with your primary care physician for further management.  If any symptoms change or worsen, please return to the nearest emergency department.  Please stay hydrated.

## 2017-10-24 NOTE — ED Notes (Signed)
Able to contact pt.'s friend but friend unable to come to Ed  To pick up pt.'s friend doesn't  Drive. Told this Nurse to come at around 6am to catch the bus. Pt. Asleep , no s/s of distress noted, easily arouseable to calling .

## 2017-12-06 ENCOUNTER — Emergency Department (HOSPITAL_COMMUNITY)
Admission: EM | Admit: 2017-12-06 | Discharge: 2017-12-06 | Disposition: A | Discharge status: Home / Self Care | Payer: Self-pay | Attending: Emergency Medicine | Admitting: Emergency Medicine

## 2017-12-06 ENCOUNTER — Emergency Department (HOSPITAL_COMMUNITY): Payer: Self-pay

## 2017-12-06 ENCOUNTER — Encounter (HOSPITAL_COMMUNITY): Payer: Self-pay | Admitting: Emergency Medicine

## 2017-12-06 DIAGNOSIS — F17210 Nicotine dependence, cigarettes, uncomplicated: Secondary | ICD-10-CM | POA: Insufficient documentation

## 2017-12-06 DIAGNOSIS — R05 Cough: Secondary | ICD-10-CM | POA: Insufficient documentation

## 2017-12-06 DIAGNOSIS — R062 Wheezing: Secondary | ICD-10-CM | POA: Insufficient documentation

## 2017-12-06 DIAGNOSIS — J029 Acute pharyngitis, unspecified: Secondary | ICD-10-CM | POA: Insufficient documentation

## 2017-12-06 DIAGNOSIS — R0981 Nasal congestion: Secondary | ICD-10-CM | POA: Insufficient documentation

## 2017-12-06 DIAGNOSIS — Z79899 Other long term (current) drug therapy: Secondary | ICD-10-CM | POA: Insufficient documentation

## 2017-12-06 MED ORDER — AEROCHAMBER PLUS FLO-VU MEDIUM MISC
1.0000 | Freq: Once | Status: AC
  Administered 2017-12-06: 1
  Filled 2017-12-06: qty 1

## 2017-12-06 MED ORDER — CETIRIZINE HCL 10 MG PO TABS: 10.0000 mg | ORAL_TABLET | Freq: Every day | ORAL | 1 refills | Status: AC

## 2017-12-06 MED ORDER — FLUTICASONE PROPIONATE 50 MCG/ACT NA SUSP: 1.0000 | Freq: Every day | NASAL | 2 refills | Status: AC

## 2017-12-06 MED ORDER — MONTELUKAST SODIUM 10 MG PO TABS: 10.0000 mg | ORAL_TABLET | Freq: Every day | ORAL | 1 refills | Status: AC

## 2017-12-06 MED ORDER — ALBUTEROL SULFATE HFA 108 (90 BASE) MCG/ACT IN AERS: 1.0000 | INHALATION_SPRAY | Freq: Four times a day (QID) | RESPIRATORY_TRACT | 0 refills | Status: AC | PRN

## 2017-12-06 MED ORDER — ALBUTEROL SULFATE HFA 108 (90 BASE) MCG/ACT IN AERS
2.0000 | INHALATION_SPRAY | RESPIRATORY_TRACT | Status: DC | PRN
  Administered 2017-12-06: 2 via RESPIRATORY_TRACT
  Filled 2017-12-06: qty 6.7

## 2017-12-06 NOTE — ED Provider Notes (Signed)
[facility]-EMERGENCY DEPT Provider Note   CSN: [id number] Arrival date & time: 12/06/17  0755     History   Chief Complaint Chief Complaint  Patient presents [id number]  . Nasal Congestion  . Cough  . Sore Throat    HPI Logan Lee is a 42 y.o. male.  HPI  Patient is a 42 year old male with a history of asthma presenting for nasal congestion, coughing, and wheezing.  Patient reports he has a a history of getting the symptoms with the change of seasons, and he has had persistently worsening symptoms this particular season.  Patient works in [employer]eron[employer]ical [employer].  Patient reports that he  has clear rhinorrhea and congestion that makes it hard to breathe.  Patient also notes that he has been coughing more persistently, but it is nonproductive.  Patient reports that he is noted he is wheezing more.  Patient has a history of asthma, but reports he does not have a primary care provider to prescribe him inhalers.  Patient denies otalgia or pain in his throat.  Patient denies fevers, chills, chest pain, shortness of breath, abdominal pain, nausea, vomiting.  Patient reports he has tried OTC allergy medications intermittently, but they have not been successful.  Past Medical History:  Diagnosis Date  . Distal radius fracture, left 08/18/2016  . Thumb fracture 08/18/2016   right    There are no active problems to display for this patient.   Past Surgical History:  Procedure Laterality Date  . NO PAST SURGERIES    . OPEN REDUCTION INTERNAL FIXATION (ORIF) DISTAL PHALANX Right 09/13/2016   Procedure: OPEN REDUCTION DISTAL PHALANX;  Surgeon: Betha LoaKevin Kuzma, MD;  Location: [facility];  Service: Orthopedics;  Laterality: Right;  . OPEN REDUCTION INTERNAL FIXATION (ORIF) DISTAL RADIAL FRACTURE Left 09/13/2016   Procedure: OPEN REDUCTION INTERNAL FIXATION (ORIF) LEFT DISTAL RADIAL FRACTURE;  Surgeon: Betha LoaKevin Kuzma, MD;  Location: [facility];  Service:  Orthopedics;  Laterality: Left;        Home Medications    Prior to Admission medications   Medication Sig Start Date End Date Taking? Authorizing Provider  albuterol (PROVENTIL HFA;VENTOLIN HFA) 108 (90 Base) MCG/ACT inhaler Inhale 1-2 puffs into the lungs every 6 (six) hours as needed for wheezing or shortness of breath. 12/06/17   Aviva KluverMurray, Alyssa B, PA-C  cetirizine (ZYRTEC) 10 MG tablet Take 1 tablet (10 mg total) by mouth daily. 12/06/17 02/04/18  Aviva KluverMurray, Alyssa B, PA-C  fluticasone (FLONASE) 50 MCG/ACT nasal spray Place 1 spray into both nostrils daily. 12/06/17   Aviva KluverMurray, Alyssa B, PA-C  montelukast (SINGULAIR) 10 MG tablet Take 1 tablet (10 mg total) by mouth at bedtime. 12/06/17 02/04/18  Aviva KluverMurray, Alyssa B, PA-C  oxyCODONE-acetaminophen (PERCOCET) 5-325 MG tablet 1-2 tabs PO q6 hours prn pain Patient not taking: Reported on 10/03/2017 09/13/16   Betha LoaKuzma, Kevin, MD    Family History No family history on file.  Social History Social History   Tobacco Use  . Smoking status: Current Some Day Smoker    Packs/day: 0.00    Types: Cigarettes    Last attempt to quit: 09/05/2016    Years since quitting: 1.2  . Smokeless tobacco: Never Used  . Tobacco comment: one to two cigerettes a week  Substance Use Topics  . Alcohol use: Yes    Comment: occasionally  . Drug use: No     Allergies   Patient has no known allergies.   Review of Systems Review of Systems  Constitutional: Negative for chills and fever.  HENT: Positive for congestion, rhinorrhea and sneezing. Negative for ear pain and sore throat.   Respiratory: Positive for cough. Negative for shortness of breath.   Cardiovascular: Negative for chest pain.  Gastrointestinal: Negative for abdominal pain, nausea and vomiting.     Physical Exam Updated Vital Signs BP 122/72   Pulse (!) 56   Temp 97.8 F (36.6 C)   Resp 16   SpO2 98%   Physical Exam  Constitutional: He appears well-developed and well-nourished. No distress.    Sitting comfortably in bed.  HENT:  Head: Normocephalic and atraumatic.  Mouth/Throat: Uvula is midline and oropharynx is clear and moist. No oropharyngeal exudate.  Eyes: Conjunctivae are normal. Right eye exhibits no discharge. Left eye exhibits no discharge.  EOMs normal to gross examination.  Neck: Normal range of motion.  Cardiovascular: Normal rate, regular rhythm and normal heart sounds.  Pulmonary/Chest: Effort normal. He has wheezes.  Patient exhibits soft end expiratory wheezes on auscultation.  Abdominal: He exhibits no distension.  Musculoskeletal: Normal range of motion.  Neurological: He is alert.  Cranial nerves intact to gross observation. Patient moves extremities without difficulty.  Skin: Skin is warm and dry. He is not diaphoretic.  Psychiatric: He has a normal mood and affect. His behavior is normal. Judgment and thought content normal.  Nursing note and vitals reviewed.    ED Treatments / Results  Labs (all labs ordered are listed, but only abnormal results are displayed) Labs Reviewed - No data to display  EKG None  Radiology Dg Chest 2 View  Result Date: 12/06/2017 CLINICAL DATA:  Wheezing. EXAM: CHEST - 2 VIEW COMPARISON:  Radiographs of October 23, 2017 FINDINGS: The heart size and mediastinal contours are within normal limits. Both lungs are clear. No pneumothorax or pleural effusion is noted. The visualized skeletal structures are unremarkable. IMPRESSION: No active cardiopulmonary disease. Electronically Signed   By: Lupita Raider, M.D.   On: 12/06/2017 10:47    Procedures Procedures (including critical care time)  Medications Ordered in ED Medications  AEROCHAMBER PLUS FLO-VU MEDIUM MISC 1 each (1 each Other Given 12/06/17 1056)     Initial Impression / Assessment and Plan / ED Course  I have reviewed the triage vital signs and the nursing notes.  Pertinent labs & imaging results that were available during my care of the patient were  reviewed by me and considered in my medical decision making (see chart for details).     Patient nontoxic-appearing, afebrile, and in no acute distress.  Patient does exhibit wheezing, as well as symptoms consistent with allergic rhinitis.  No evidence of upper respiratory infection, and chest x-ray is clear for pulmonary abnormality or infiltrate.  Will refill patient's inhaler given that he is wheezing.  Additionally, add allergic rhinitis therapy including montelukast, cetirizine, and Flonase.  Patient given return precautions for any chest pain, shortness of breath patient is in understanding and agrees with the plan of care..   Final Clinical Impressions(s) / ED Diagnoses   Final diagnoses:  Nasal congestion  Wheezing    ED Discharge Orders        Ordered    albuterol (PROVENTIL HFA;VENTOLIN HFA) 108 (90 Base) MCG/ACT inhaler  Every 6 hours PRN     12/06/17 1140    montelukast (SINGULAIR) 10 MG tablet  Daily at bedtime     12/06/17 1140    cetirizine (ZYRTEC) 10 MG tablet  Daily     12/06/17 1140  fluticasone (FLONASE) 50 MCG/ACT nasal spray  Daily     12/06/17 1140       Delia Chimes 12/06/17 1639    Gerhard Munch, MD 12/10/17 2210

## 2017-12-06 NOTE — Discharge Instructions (Addendum)
°  Tu radiografa de pecho se ve bien. Te prescribo medicamentos para ayudar con las alergias.  Tomar albuterol, 1  2 inhalaciones, segn sea necesario, cada 4-6 horas. Tomar Montelukast 10 mg por la noche y Cetirizine 10 mg por la noche. Flonase se toma una vez al da por la [place], 1 inhalacin en la nariz.  Regrese por cualquier dolor de pecho o falta de aliento.  Por favor, trate de establecer atencin primaria con las clnicas que enumer. Atienden a los pacientes por su capacidad de pago y usted no necesita un seguro.  Your chest xray looks good. I am prescribing you medicines to help with allergies.   You will take albuterol, 1-2 puffs as needed every 4-6 hours. You will take Montelukast 10 mg at night and Cetirizine 10 mg at night. Flonase is taken once daily in the morning, 1 puff in the nose.  Return for any chest pain or shortness of breath.  Please try to establish primary care with the clinics I listed. They take patients on an ability to pay basis and you do not need insurance.

## 2017-12-06 NOTE — ED Triage Notes (Signed)
Pt c/o sinus congestion and sore throat for 2 weeks. States he been taking medicine for allergies.

## 2018-03-25 ENCOUNTER — Emergency Department (HOSPITAL_COMMUNITY)
Admission: EM | Admit: 2018-03-25 | Discharge: 2018-03-25 | Discharge status: Against Medical Advice | Payer: Self-pay | Attending: Emergency Medicine | Admitting: Emergency Medicine

## 2018-03-25 DIAGNOSIS — Y929 Unspecified place or not applicable: Secondary | ICD-10-CM | POA: Insufficient documentation

## 2018-03-25 DIAGNOSIS — X58XXXA Exposure to other specified factors, initial encounter: Secondary | ICD-10-CM | POA: Insufficient documentation

## 2018-03-25 DIAGNOSIS — Z5321 Procedure and treatment not carried out due to patient leaving prior to being seen by health care provider: Secondary | ICD-10-CM | POA: Insufficient documentation

## 2018-03-25 DIAGNOSIS — Y939 Activity, unspecified: Secondary | ICD-10-CM | POA: Insufficient documentation

## 2018-03-25 DIAGNOSIS — Y999 Unspecified external cause status: Secondary | ICD-10-CM | POA: Insufficient documentation

## 2018-05-26 ENCOUNTER — Emergency Department (HOSPITAL_COMMUNITY)
Admission: EM | Admit: 2018-05-26 | Discharge: 2018-05-26 | Disposition: A | Discharge status: Home / Self Care | Payer: Self-pay | Attending: Emergency Medicine | Admitting: Emergency Medicine

## 2018-05-26 ENCOUNTER — Encounter (HOSPITAL_COMMUNITY): Payer: Self-pay

## 2018-05-26 ENCOUNTER — Other Ambulatory Visit: Payer: Self-pay

## 2018-05-26 DIAGNOSIS — Z79899 Other long term (current) drug therapy: Secondary | ICD-10-CM | POA: Insufficient documentation

## 2018-05-26 DIAGNOSIS — Z23 Encounter for immunization: Secondary | ICD-10-CM | POA: Insufficient documentation

## 2018-05-26 DIAGNOSIS — Z87891 Personal history of nicotine dependence: Secondary | ICD-10-CM | POA: Insufficient documentation

## 2018-05-26 DIAGNOSIS — Y990 Civilian activity done for income or pay: Secondary | ICD-10-CM | POA: Insufficient documentation

## 2018-05-26 DIAGNOSIS — Y929 Unspecified place or not applicable: Secondary | ICD-10-CM | POA: Insufficient documentation

## 2018-05-26 DIAGNOSIS — S81012A Laceration without foreign body, left knee, initial encounter: Secondary | ICD-10-CM | POA: Insufficient documentation

## 2018-05-26 DIAGNOSIS — W3182XA Contact with other commercial machinery, initial encounter: Secondary | ICD-10-CM | POA: Insufficient documentation

## 2018-05-26 DIAGNOSIS — Y939 Activity, unspecified: Secondary | ICD-10-CM | POA: Insufficient documentation

## 2018-05-26 MED ORDER — LIDOCAINE HCL (PF) 1 % IJ SOLN
5.0000 mL | Freq: Once | INTRAMUSCULAR | Status: AC
  Administered 2018-05-26: 5 mL
  Filled 2018-05-26: qty 5

## 2018-05-26 MED ORDER — TETANUS-DIPHTH-ACELL PERTUSSIS 5-2.5-18.5 LF-MCG/0.5 IM SUSP
0.5000 mL | Freq: Once | INTRAMUSCULAR | Status: AC
Start: 2018-05-26 — End: 2018-05-26
  Administered 2018-05-26: 0.5 mL via INTRAMUSCULAR
  Filled 2018-05-26: qty 0.5

## 2018-05-26 NOTE — ED Provider Notes (Signed)
[facility] EMERGENCY DEPARTMENT Provider Note   CSN: [id number] Arrival date & time: 05/26/18  1103     History   Chief Complaint Chief Complaint  Patient presents with  . Laceration    HPI Logan Lee is a 42 y.o. male.  He was injured at work today and presents here with 2 lacerations to his left knee.  It sounds like he has some sort of machine that cuts concrete and it skipped and hit him in the leg.  He denies other injury and there is no significant pain.  He is able to walk without difficulty.  He is unsure when his last tetanus shot was.  The history is provided by the patient.  Laceration   The incident occurred 1 to 2 hours ago. The laceration is located on the left leg. The laceration is 4 cm in size. The laceration mechanism was a a metal edge. The patient is experiencing no pain. He reports no foreign bodies present. His tetanus status is unknown.    Past Medical History:  Diagnosis Date  . Distal radius fracture, left 08/18/2016  . Thumb fracture 08/18/2016   right    There are no active problems to display for this patient.   Past Surgical History:  Procedure Laterality Date  . NO PAST SURGERIES    . OPEN REDUCTION INTERNAL FIXATION (ORIF) DISTAL PHALANX Right 09/13/2016   Procedure: OPEN REDUCTION DISTAL PHALANX;  Surgeon: Betha Loa, MD;  Location: [facility];  Service: Orthopedics;  Laterality: Right;  . OPEN REDUCTION INTERNAL FIXATION (ORIF) DISTAL RADIAL FRACTURE Left 09/13/2016   Procedure: OPEN REDUCTION INTERNAL FIXATION (ORIF) LEFT DISTAL RADIAL FRACTURE;  Surgeon: Betha Loa, MD;  [facility];  Service: Orthopedics;  Laterality: Left;        Home Medications    Prior to Admission medications   Medication Sig Start Date End Date Taking? Authorizing Provider  albuterol (PROVENTIL HFA;VENTOLIN HFA) 108 (90 Base) MCG/ACT inhaler Inhale 1-2 puffs into the lungs every 6 (six) hours  as needed for wheezing or shortness of breath. 12/06/17   Aviva Kluver B, PA-C  cetirizine (ZYRTEC) 10 MG tablet Take 1 tablet (10 mg total) by mouth daily. 12/06/17 02/04/18  Aviva Kluver B, PA-C  fluticasone (FLONASE) 50 MCG/ACT nasal spray Place 1 spray into both nostrils daily. 12/06/17   Aviva Kluver B, PA-C  montelukast (SINGULAIR) 10 MG tablet Take 1 tablet (10 mg total) by mouth at bedtime. 12/06/17 02/04/18  Aviva Kluver B, PA-C  oxyCODONE-acetaminophen (PERCOCET) 5-325 MG tablet 1-2 tabs PO q6 hours prn pain Patient not taking: Reported on 10/03/2017 09/13/16   Betha Loa, MD    Family History History reviewed. No pertinent family history.  Social History Social History   Tobacco Use  . Smoking status: Former Smoker    Packs/day: 0.00    Types: Cigarettes    Last attempt to quit: 09/05/2016    Years since quitting: 1.7  . Smokeless tobacco: Never Used  . Tobacco comment: one to two cigerettes a week  Substance Use Topics  . Alcohol use: Yes    Comment: occasionally  . Drug use: No     Allergies   Patient has no known allergies.   Review of Systems Review of Systems  Constitutional: Negative for fever.  Respiratory: Negative for shortness of breath.   Cardiovascular: Negative for chest pain.  Gastrointestinal: Negative for abdominal pain.  Musculoskeletal: Negative for gait problem.  Skin: Positive  for wound. Negative for rash.     Physical Exam Updated Vital Signs BP 111/88 (BP Location: Right Arm)   Pulse 72   Temp 97.9 F (36.6 C) (Oral)   Ht 5\' 6"  (1.676 m)   Wt 55.8 kg   SpO2 100%   BMI 19.86 kg/m   Physical Exam  Constitutional: He appears well-developed and well-nourished.  HENT:  Head: Normocephalic and atraumatic.  Eyes: Conjunctivae are normal.  Neck: Neck supple.  Pulmonary/Chest: Effort normal.  Musculoskeletal: Normal range of motion.  Left knee full range of motion extensor mechanism intact no body tenderness.  There are 2 2 cm  lacerations on the medial aspect of the anterior knee.  There is no foreign body in the wound.  There is no active bleeding.  Neurological: He is alert. GCS eye subscore is 4. GCS verbal subscore is 5. GCS motor subscore is 6.  Skin: Skin is warm and dry.  Psychiatric: He has a normal mood and affect.  Nursing note and vitals reviewed.    ED Treatments / Results  Labs (all labs ordered are listed, but only abnormal results are displayed) Labs Reviewed - No data to display  EKG None  Radiology No results found.  Procedures .Marland KitchenLaceration Repair Date/Time: 05/26/2018 12:03 PM Performed by: Terrilee Files, MD Authorized by: Terrilee Files, MD   Consent:    Consent obtained:  Verbal   Consent given by:  Patient   Risks discussed:  Infection, pain, poor cosmetic result, poor wound healing and retained foreign body   Alternatives discussed:  No treatment and delayed treatment Anesthesia (see MAR for exact dosages):    Anesthesia method:  Local infiltration   Local anesthetic:  Lidocaine 1% w/o epi Laceration details:    Location:  Leg   Leg location:  L knee   Length (cm):  4 Repair type:    Repair type:  Simple Pre-procedure details:    Preparation:  Patient was prepped and draped in usual sterile fashion Exploration:    Wound extent: no tendon damage noted     Contaminated: no   Treatment:    Area cleansed with:  Saline   Amount of cleaning:  Standard Skin repair:    Repair method:  Sutures   Suture size:  4-0   Suture material:  Nylon   Suture technique:  Simple interrupted   Number of sutures:  4 Approximation:    Approximation:  Close Post-procedure details:    Dressing:  Sterile dressing   Patient tolerance of procedure:  Tolerated well, no immediate complications   (including critical care time)  Medications Ordered in ED Medications  lidocaine (PF) (XYLOCAINE) 1 % injection 5 mL (has no administration in time range)  Tdap (BOOSTRIX) injection 0.5  mL (has no administration in time range)     Initial Impression / Assessment and Plan / ED Course  I have reviewed the triage vital signs and the nursing notes.  Pertinent labs & imaging results that were available during my care of the patient were reviewed by me and considered in my medical decision making (see chart for details).    Patient had 2 lacerations approximately each 2 cm that were closed in similar fashion and procedure note reflects that.  He would need his tetanus updated.    Final Clinical Impressions(s) / ED Diagnoses   Final diagnoses:  Laceration of left knee, initial encounter    ED Discharge Orders    None  Terrilee Files, MD 05/27/18 [phone]

## 2018-05-26 NOTE — ED Triage Notes (Signed)
Pt has 2- 1.5cm lacerations to left knee that occurred at work this morning while working on a machine. Bandage in place. VSS.

## 2018-05-26 NOTE — Discharge Instructions (Addendum)
You were seen in the emergency department for a laceration to your left knee.  You had sutures placed and these will need to be removed in 10 to 12 days.  Watch for signs of infection-redness swelling drainage pain.  Return if any concerns.  Keep the area clean with soap and water.

## 2018-06-04 ENCOUNTER — Emergency Department (HOSPITAL_COMMUNITY)
Admission: EM | Admit: 2018-06-04 | Discharge: 2018-06-04 | Disposition: A | Discharge status: Home / Self Care | Payer: Self-pay | Attending: Emergency Medicine | Admitting: Emergency Medicine

## 2018-06-04 ENCOUNTER — Encounter (HOSPITAL_COMMUNITY): Payer: Self-pay | Admitting: *Deleted

## 2018-06-04 DIAGNOSIS — Z87891 Personal history of nicotine dependence: Secondary | ICD-10-CM | POA: Insufficient documentation

## 2018-06-04 DIAGNOSIS — Z4802 Encounter for removal of sutures: Secondary | ICD-10-CM | POA: Insufficient documentation

## 2018-06-04 NOTE — ED Provider Notes (Signed)
[facility] EMERGENCY DEPARTMENT Provider Note   CSN: [id number] Arrival date & time: 06/04/18  4098     History   Chief Complaint Chief Complaint  Patient presents with  . Suture / Staple Removal    HPI Logan Lee is a 42 y.o. male.  42 year old male presents for suture removal.  Patient was seen in the ER May 26, 2018 for laceration to the left knee.  Tetanus was updated at the time of his last visit.  Reports wound is healing well, no complaints or concerns.     Past Medical History:  Diagnosis Date  . Distal radius fracture, left 08/18/2016  . Thumb fracture 08/18/2016   right    There are no active problems to display for this patient.   Past Surgical History:  Procedure Laterality Date  . NO PAST SURGERIES    . OPEN REDUCTION INTERNAL FIXATION (ORIF) DISTAL PHALANX Right 09/13/2016   Procedure: OPEN REDUCTION DISTAL PHALANX;  Surgeon: Betha Loa, MD;  Location: [facility];  Service: Orthopedics;  Laterality: Right;  . OPEN REDUCTION INTERNAL FIXATION (ORIF) DISTAL RADIAL FRACTURE Left 09/13/2016   Procedure: OPEN REDUCTION INTERNAL FIXATION (ORIF) LEFT DISTAL RADIAL FRACTURE;  Surgeon: Betha Loa, MD;  Location: [facility];  Service: Orthopedics;  Laterality: Left;        Home Medications    Prior to Admission medications   Medication Sig Start Date End Date Taking? Authorizing Provider  albuterol (PROVENTIL HFA;VENTOLIN HFA) 108 (90 Base) MCG/ACT inhaler Inhale 1-2 puffs into the lungs every 6 (six) hours as needed for wheezing or shortness of breath. Patient not taking: Reported on 06/04/2018 12/06/17   Aviva Kluver B, PA-C  cetirizine (ZYRTEC) 10 MG tablet Take 1 tablet (10 mg total) by mouth daily. 12/06/17 02/04/18  Aviva Kluver B, PA-C  fluticasone (FLONASE) 50 MCG/ACT nasal spray Place 1 spray into both nostrils daily. Patient not taking: Reported on 06/04/2018 12/06/17   Aviva Kluver B,  PA-C  montelukast (SINGULAIR) 10 MG tablet Take 1 tablet (10 mg total) by mouth at bedtime. 12/06/17 02/04/18  Elisha Ponder, PA-C    Family History History reviewed. No pertinent family history.  Social History Social History   Tobacco Use  . Smoking status: Former Smoker    Packs/day: 0.00    Types: Cigarettes    Last attempt to quit: 09/05/2016    Years since quitting: 1.7  . Smokeless tobacco: Never Used  . Tobacco comment: one to two cigerettes a week  Substance Use Topics  . Alcohol use: Yes    Comment: occasionally  . Drug use: No     Allergies   Patient has no known allergies.   Review of Systems Review of Systems  Constitutional: Negative for fever.  Musculoskeletal: Negative for arthralgias, gait problem, joint swelling and myalgias.  Skin: Positive for wound. Negative for color change and rash.  Allergic/Immunologic: Negative for immunocompromised state.  Neurological: Negative for weakness and numbness.  Hematological: Does not bruise/bleed easily.  Psychiatric/Behavioral: Negative for self-injury.  All other systems reviewed and are negative.    Physical Exam Updated Vital Signs BP 109/84 (BP Location: Right Arm)   Pulse 72   Temp 98.6 F (37 C) (Oral)   Resp 20   SpO2 100%   Physical Exam  Constitutional: He is oriented to person, place, and time. He appears well-developed and well-nourished. No distress.  HENT:  Head: Normocephalic and atraumatic.  Cardiovascular: Intact distal pulses.  Pulmonary/Chest: Effort normal.  Musculoskeletal: He exhibits no tenderness or deformity.       Legs: Neurological: He is alert and oriented to person, place, and time. No sensory deficit.  Skin: Skin is warm and dry. He is not diaphoretic.  Psychiatric: He has a normal mood and affect. His behavior is normal.  Nursing note and vitals reviewed.    ED Treatments / Results  Labs (all labs ordered are listed, but only abnormal results are displayed) Labs  Reviewed - No data to display  EKG None  Radiology No results found.  Procedures .Suture Removal Date/Time: 06/04/2018 10:11 AM Performed by: Jeannie Fend, PA-C Authorized by: Jeannie Fend, PA-C   Consent:    Consent obtained:  Verbal   Consent given by:  Patient   Risks discussed:  Bleeding, pain and wound separation   Alternatives discussed:  No treatment Location:    Location:  Lower extremity   Lower extremity location:  Knee   Knee location:  L knee Procedure details:    Wound appearance:  No signs of infection   Number of sutures removed:  4 Post-procedure details:    Post-removal:  Dressing applied   Patient tolerance of procedure:  Tolerated well, no immediate complications   (including critical care time)  Medications Ordered in ED Medications - No data to display   Initial Impression / Assessment and Plan / ED Course  I have reviewed the triage vital signs and the nursing notes.  Pertinent labs & imaging results that were available during my care of the patient were reviewed by me and considered in my medical decision making (see chart for details).    Final Clinical Impressions(s) / ED Diagnoses   Final diagnoses:  Visit for suture removal    ED Discharge Orders    None       Jeannie Fend, PA-C 06/04/18 1012    Terrilee Files, MD 06/04/18 [phone]

## 2018-06-04 NOTE — ED Triage Notes (Signed)
Pt in to get stitches removed from his left knee placed 10 days ago, no distress noted

## 2018-06-04 NOTE — ED Notes (Signed)
Declined W/C at D/C and was escorted to lobby by RN. 
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
## 2018-08-20 ENCOUNTER — Emergency Department (HOSPITAL_COMMUNITY)
Admission: EM | Admit: 2018-08-20 | Discharge: 2018-08-20 | Disposition: A | Discharge status: Home / Self Care | Payer: Self-pay | Attending: Emergency Medicine | Admitting: Emergency Medicine

## 2018-08-20 ENCOUNTER — Other Ambulatory Visit: Payer: Self-pay

## 2018-08-20 ENCOUNTER — Encounter (HOSPITAL_COMMUNITY): Payer: Self-pay | Admitting: Emergency Medicine

## 2018-08-20 ENCOUNTER — Emergency Department (HOSPITAL_COMMUNITY): Payer: Self-pay

## 2018-08-20 DIAGNOSIS — W010XXA Fall on same level from slipping, tripping and stumbling without subsequent striking against object, initial encounter: Secondary | ICD-10-CM | POA: Insufficient documentation

## 2018-08-20 DIAGNOSIS — F10920 Alcohol use, unspecified with intoxication, uncomplicated: Secondary | ICD-10-CM | POA: Insufficient documentation

## 2018-08-20 DIAGNOSIS — Y908 Blood alcohol level of 240 mg/100 ml or more: Secondary | ICD-10-CM | POA: Insufficient documentation

## 2018-08-20 DIAGNOSIS — Y929 Unspecified place or not applicable: Secondary | ICD-10-CM | POA: Insufficient documentation

## 2018-08-20 DIAGNOSIS — Y9389 Activity, other specified: Secondary | ICD-10-CM | POA: Insufficient documentation

## 2018-08-20 DIAGNOSIS — S61411A Laceration without foreign body of right hand, initial encounter: Secondary | ICD-10-CM

## 2018-08-20 DIAGNOSIS — Y999 Unspecified external cause status: Secondary | ICD-10-CM | POA: Insufficient documentation

## 2018-08-20 DIAGNOSIS — Z87891 Personal history of nicotine dependence: Secondary | ICD-10-CM | POA: Insufficient documentation

## 2018-08-20 DIAGNOSIS — S61422A Laceration with foreign body of left hand, initial encounter: Secondary | ICD-10-CM | POA: Insufficient documentation

## 2018-08-20 DIAGNOSIS — Z1881 Retained glass fragments: Secondary | ICD-10-CM | POA: Insufficient documentation

## 2018-08-20 LAB — ETHANOL: Alcohol, Ethyl (B): 313 mg/dL (ref <10)

## 2018-08-20 NOTE — ED Notes (Signed)
Patient transported to X-ray 

## 2018-08-20 NOTE — Discharge Instructions (Addendum)
It was our pleasure to provide your ER care today - we hope that you feel better.  Avoid alcohol use - follow up with AA, and use resource guide provided for additional community resources.   No driving for the next 8 hours, or any time when drinking alcohol.   Keep wounds very clean, wash with soap and water 2x/day.   We removed a small piece of glass from one of your wounds, it is possible there are other tiny pieces of glass in your wound.    Return to ER if worse, new symptoms, new or severe pain, other concern.
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
## 2018-08-20 NOTE — ED Notes (Signed)
Patient verbalizes understanding of discharge instructions. Opportunity for questioning and answers were provided. Armband removed by staff, pt discharged from ED ambulatory by self. Steady gait noted, pt denies acute distress or complaints. GCS 15, NAD R hand lacs cleansed, and dsg applied.
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
## 2019-11-13 ENCOUNTER — Emergency Department (HOSPITAL_COMMUNITY)
Admission: EM | Admit: 2019-11-13 | Discharge: 2019-11-13 | Disposition: A | Discharge status: Home / Self Care | Payer: Self-pay | Attending: Emergency Medicine | Admitting: Emergency Medicine

## 2019-11-13 ENCOUNTER — Other Ambulatory Visit: Payer: Self-pay

## 2019-11-13 DIAGNOSIS — H5713 Ocular pain, bilateral: Secondary | ICD-10-CM | POA: Insufficient documentation

## 2019-11-13 DIAGNOSIS — Z77098 Contact with and (suspected) exposure to other hazardous, chiefly nonmedicinal, chemicals: Secondary | ICD-10-CM | POA: Insufficient documentation

## 2019-11-13 DIAGNOSIS — F10929 Alcohol use, unspecified with intoxication, unspecified: Secondary | ICD-10-CM | POA: Insufficient documentation
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
## 2019-11-13 NOTE — ED Notes (Signed)
Attempting to arouse pt to drink water. Pt opens eyes and falls back to sleep. MD made aware.

## 2019-11-13 NOTE — Discharge Instructions (Addendum)
Followup with your doctor as needed

## 2019-11-13 NOTE — ED Notes (Signed)
PT got dressed stated he was ready to go and that he called his friend "pedro" to come get him PT Oriented x4 and ambulatory with even steady gate. Pt ambulated out of ED and given discharge instructions.

## 2019-11-13 NOTE — ED Notes (Signed)
Pt arousable to gentle shaking. Resp wnl, equal and non-labored. Skin w/d/pink. Mumbles then falls asleep. Unable to get pt to drink water at this time. Cont to monitor.

## 2019-11-13 NOTE — ED Notes (Signed)
Pt awake, alert, laughing while this RN asking questions. Refusing to answer. Unable to obtain full triage questions. S/B Dr. Freida Busman. Pt denies pain anywhere. Unable to tell MD or RN who can be called at this time. Water and crackers given at this time.
